# Patient Record
Sex: Female | Born: 1950
Health system: Southern US, Community
[De-identification: ages and names within clinical notes are randomized; demographics above are authoritative.]

## PROBLEM LIST (undated history)

## (undated) DIAGNOSIS — B019 Varicella without complication: Secondary | ICD-10-CM

## (undated) DIAGNOSIS — K219 Gastro-esophageal reflux disease without esophagitis: Secondary | ICD-10-CM

## (undated) DIAGNOSIS — E785 Hyperlipidemia, unspecified: Secondary | ICD-10-CM

## (undated) DIAGNOSIS — N390 Urinary tract infection, site not specified: Secondary | ICD-10-CM

## (undated) DIAGNOSIS — A630 Anogenital (venereal) warts: Secondary | ICD-10-CM

## (undated) HISTORY — PX: COLONOSCOPY WITH ESOPHAGOGASTRODUODENOSCOPY (EGD): SHX5779

## (undated) HISTORY — DX: Anogenital (venereal) warts: A63.0

## (undated) HISTORY — DX: Gastro-esophageal reflux disease without esophagitis: K21.9

## (undated) HISTORY — DX: Urinary tract infection, site not specified: N39.0

## (undated) HISTORY — DX: Varicella without complication: B01.9

---

## 2007-04-11 ENCOUNTER — Observation Stay (HOSPITAL_COMMUNITY): Admission: EM | Admit: 2007-04-11 | Discharge: 2007-04-12 | Payer: Self-pay | Admitting: Emergency Medicine

## 2007-07-04 ENCOUNTER — Ambulatory Visit: Payer: Self-pay | Admitting: Family Medicine

## 2007-07-04 DIAGNOSIS — E785 Hyperlipidemia, unspecified: Secondary | ICD-10-CM

## 2007-07-04 DIAGNOSIS — K219 Gastro-esophageal reflux disease without esophagitis: Secondary | ICD-10-CM | POA: Insufficient documentation

## 2007-07-04 DIAGNOSIS — A630 Anogenital (venereal) warts: Secondary | ICD-10-CM

## 2007-07-04 DIAGNOSIS — O99810 Abnormal glucose complicating pregnancy: Secondary | ICD-10-CM | POA: Insufficient documentation

## 2007-07-13 ENCOUNTER — Ambulatory Visit: Payer: Self-pay | Admitting: Family Medicine

## 2007-07-13 ENCOUNTER — Encounter: Payer: Self-pay | Admitting: Family Medicine

## 2007-07-13 ENCOUNTER — Other Ambulatory Visit: Admission: RE | Admit: 2007-07-13 | Discharge: 2007-07-13 | Payer: Self-pay | Admitting: Family Medicine

## 2007-07-14 LAB — CONVERTED CEMR LAB: Pap Smear: NORMAL

## 2007-07-20 ENCOUNTER — Encounter (INDEPENDENT_AMBULATORY_CARE_PROVIDER_SITE_OTHER): Payer: Self-pay | Admitting: *Deleted

## 2007-08-04 ENCOUNTER — Ambulatory Visit: Payer: Self-pay | Admitting: Family Medicine

## 2007-08-09 ENCOUNTER — Encounter (INDEPENDENT_AMBULATORY_CARE_PROVIDER_SITE_OTHER): Payer: Self-pay | Admitting: *Deleted

## 2007-10-04 ENCOUNTER — Ambulatory Visit: Payer: Self-pay | Admitting: Family Medicine

## 2007-10-11 LAB — CONVERTED CEMR LAB
ALT: 20 units/L (ref 0–35)
Albumin: 4 g/dL (ref 3.5–5.2)
Alkaline Phosphatase: 69 units/L (ref 39–117)
BUN: 11 mg/dL (ref 6–23)
CO2: 31 meq/L (ref 19–32)
Chloride: 105 meq/L (ref 96–112)
Cholesterol: 226 mg/dL (ref 0–200)
GFR calc non Af Amer: 79 mL/min
Glucose, Bld: 105 mg/dL — ABNORMAL HIGH (ref 70–99)
Sodium: 141 meq/L (ref 135–145)
Total CHOL/HDL Ratio: 6.3
VLDL: 44 mg/dL — ABNORMAL HIGH (ref 0–40)

## 2008-11-13 ENCOUNTER — Ambulatory Visit: Payer: Self-pay | Admitting: Family Medicine

## 2008-11-13 ENCOUNTER — Encounter (INDEPENDENT_AMBULATORY_CARE_PROVIDER_SITE_OTHER): Payer: Self-pay | Admitting: Internal Medicine

## 2008-11-13 DIAGNOSIS — R079 Chest pain, unspecified: Secondary | ICD-10-CM | POA: Insufficient documentation

## 2008-11-13 DIAGNOSIS — R209 Unspecified disturbances of skin sensation: Secondary | ICD-10-CM

## 2008-12-18 ENCOUNTER — Ambulatory Visit: Payer: Self-pay | Admitting: Family Medicine

## 2010-10-28 NOTE — H&P (Signed)
Kayla Ritter, Kayla Ritter                 ACCOUNT NO.:  [id number]   MEDICAL RECORD NO.:  [id number]          PATIENT TYPE:  EMS   LOCATION:  ED                           FACILITY[facility]  [facility]   PHYSICIAN:  Herbie Saxon, MDDATE OF BIRTH:  [birth year]   DATE OF ADMISSION:  04/11/2007  DATE OF DISCHARGE:                              HISTORY & PHYSICAL   PRIMARY CARE PHYSICIAN:  Unassigned.   POWER OF ATTORNEY:  Health care power of attorney is her husband,  Kayla Ritter, phone [phone], 11:53.   CODE STATUS:  She is a DNR.   PRESENTING COMPLAINT:  Chest pain 3 days' duration.   HISTORY OF PRESENTING COMPLAINT:  This is a 60 year old Caucasian lady  who was quite well until 3 days ago when she started noticing substernal  chest pressure, 8-9/10 in severity, radiating to the left arm with  occasional tingling in the left fingers, shortness of breath on moderate  exertion and intermittent palpitation.  No dizziness, no diaphoresis, no  syncopal episodes, no body swelling.  She denies any cough and history  of chest trauma.  There is no wheezing, no hemoptysis, no fever or joint  pain.  She has had frequent episodes of indigestion and heartburn.  She  takes Zantac for reflux disease.  She denies any abdominal pain,  diarrhea, constipation or change in bowel habits. No symptoms referable  to genitourinary, musculoskeletal, psychological systems.  The patient  recently lost a sister who died of a heart attack at age 31.  She is  anxious.   PAST MEDICAL HISTORY:  Gastroesophageal reflux disease.   PAST SURGICAL HISTORY:  Nil of note.   FAMILY HISTORY:  Sister with coronary artery disease.   SOCIAL HISTORY:  She is married, has 4 children, no tobacco, alcohol or  drug abuse.  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   MEDICATIONS:  Zantac as needed.   ALLERGIES:  CODEINE.   PHYSICAL EXAMINATION:  VITAL SIGNS:  The temperature is 98, pulse is 70,  respiratory rate 16,  blood pressure 161/84.  HEENT:  Pupils equal, round and reactive to light and accommodation.  Head is normocephalic, atraumatic.  Oropharynx and pharynx are clear.  NECK:  Neck is supple, there is negative jugulovenous distention or  thyromegaly.  No submandibular glandular lymph nodes.  Mucous membranes  are moist.  HEART:  Sounds 1 and 2 regular, no murmurs.  CHEST:  Clinically clear, no rales or rhonchi.  ABDOMEN:  Soft, nontender, no organomegaly.  Bowel sounds normoactive.  __________ reflexes are intact.  NEURO:  She is alert and oriented x 3, power is 5 in all limbs.  Peripheral pulses present. There is no pedal edema. Cranial nerves 2-12  intact.  Sensory system normal.  Gait is normal.   LABORATORY DATA:  Available lab showed WBC 7, hematocrit 43, platelet  count 330.  Troponin 0.05, CK-MB less than 1.  Other labs are pending.   ASSESSMENT:  Atypical chest pain.  Rule out acute coronary syndrome, new  onset hypertension, anxiety disorder.  Rule out musculoskeletal cause.  The patient admitted to observation telemetry, put on nitro paste,  oxygen as needed, aspirin, morphine intravenous as needed.  Circular  cardiac enzymes and EKG every 8 hours x 3.  Nothing by mouth from  midnight for possible stress echo in the morning.  I will obtain a 2D  echocardiogram.  Will check D-dimer, thyroid function test, lipids,  coagulation parameters, calcium, magnesium, phosphate and complete  metabolic panel. Check hemoccult.   MEDICATIONS:  She is to be on:  1. Lovenox 40 mg subcu daily.  2. Phenergan 12.5 mg IV every 8 hours as needed.  3. Protonix 1 mg IV daily.  4. Atrovent every 6 hours as needed.  5. O2 2 liters nasal canula as needed.  6. Metoprolol 25 mg daily.  7. Cardizem 10 mg IV every 6 hours as needed if heart rate is greater      than 109 or blood pressure greater than  159/109.  8. Xanax 0.5 mg b.i.d..   Nothing by mouth from midnight for possible stress echo in the  morning.  If cardiac enzymes turn positive still consider cardiology evaluation in  the morning on review of labs and clinical condition.      Herbie Saxon, MD  Electronically Signed     MIO/MEDQ  D:  04/11/2007  T:  04/12/2007  Job:  [id number]

## 2010-10-28 NOTE — Discharge Summary (Signed)
NAMESYMONE, CORNMAN                 ACCOUNT NO.:  [id number]   MEDICAL RECORD NO.:  [id number]          PATIENT TYPE:  OBV   LOCATION:  1415                         FACILITY:  [facility]   PHYSICIAN:  Lucita Ferrara, MD         DATE OF BIRTH:  Sep 07, 1950   DATE OF ADMISSION:  04/11/2007  DATE OF DISCHARGE:  04/12/2007                               DISCHARGE SUMMARY   ADMITTING DATE:  04/11/2007   DISCHARGE DATE:  04/12/2007   ADMITTING DIAGNOSIS:  Chest pain, shortness of breath.   DISCHARGE DIAGNOSES:  1. Atypical chest pain.  2. Gastroesophageal reflux disease.  3. Hyperlipidemia.   HOSPITAL COURSE:  Patient is a 60 year old female, who presented to  [facility] with chest pain, located in the anterior  substernal area, 9/10 in severity, radiating to the left shoulder.  She  had no diaphoresis, no dizziness, no syncope, no cough, no history of  trauma, no wheezing, no hemoptysis.  Otherwise, review of systems was  negative.  She did have some gastroesophageal reflux disease, heartburn  after eating.  Patient was very, very concerned when she initially  presented, because she just recently lost a sister from a heart attack.   PAST SURGICAL HISTORY:  None.   PAST MEDICAL HISTORY:  GERD, not very well-controlled.  She has never  had an endoscopy.   FAMILY HISTORY:  As above, sister with coronary artery disease.   HOSPITAL CONSULTATIONS:  None.   PROCEDURES:  Patient had EKG, 2D echocardiogram- Results; Overall Left  Ventricular Systolic function was normal. Left Ventricular EF was  estimated, range being 55-65%. There was no evidence of LV regional wall  motion abnormalitie. LV diastolic function parametes are normal.  The  EKG was normal sinus rhythm, no STT-wave changes.  Cardiac enzymes times  three negative.  Chest x-ray:  No cardiomegaly and no infiltrates, no  other acute findings.   Other result that was found was patient has high LDL.   DISCHARGE PLAN:   Patient is to be discharged home on Lipitor 20 mg  daily, continue aspirin 81 mg daily and also Nexium 40 mg p.o. daily for  control of her gastroesophageal reflux disease.  Chest pain is very  atypical.  If patient recurrently gets chest pain, will need outpatient  stress test.   CONDITION ON DISCHARGE:  Patient is able to ambulate without chest pain.  Chest pain is much better.  The patient is hemodynamically stable and  her chest pain has completely resolved at this point.      Lucita Ferrara, MD  Electronically Signed     RR/MEDQ  D:  04/12/2007  T:  04/13/2007  Job:  [id number]

## 2011-03-25 LAB — URINALYSIS, ROUTINE W REFLEX MICROSCOPIC
Glucose, UA: NEGATIVE
Nitrite: NEGATIVE
pH: 7

## 2011-03-25 LAB — COMPREHENSIVE METABOLIC PANEL
ALT: 25
Albumin: 4
BUN: 9
CO2: 28
Calcium: 9.1
Chloride: 100
Creatinine, Ser: 0.73
GFR calc non Af Amer: >60
Glucose, Bld: 103 — ABNORMAL HIGH
Total Bilirubin: 0.9
Total Protein: 7

## 2011-03-25 LAB — CBC
HCT: 43.6
Hemoglobin: 15.4 — ABNORMAL HIGH
Platelets: 330

## 2011-03-25 LAB — BASIC METABOLIC PANEL
BUN: 10
Chloride: 103
GFR calc Af Amer: >60
Glucose, Bld: 105 — ABNORMAL HIGH

## 2011-03-25 LAB — LIPID PANEL
LDL Cholesterol: 148 — ABNORMAL HIGH
Total CHOL/HDL Ratio: 7.5
VLDL: 46 — ABNORMAL HIGH

## 2011-03-25 LAB — DIFFERENTIAL
Basophils Relative: 0
Eosinophils Absolute: 0.1
Lymphocytes Relative: 30
Monocytes Relative: 6
Neutro Abs: 4.3
Neutrophils Relative %: 62

## 2011-03-25 LAB — CARDIAC PANEL(CRET KIN+CKTOT+MB+TROPI)
CK, MB: 0.9
Total CK: 53
Total CK: 55
Troponin I: 0.01
Troponin I: 0.02

## 2011-03-25 LAB — POCT CARDIAC MARKERS
CKMB, poc: <1 — ABNORMAL LOW
CKMB, poc: <1 — ABNORMAL LOW
CKMB, poc: <1 — ABNORMAL LOW
Myoglobin, poc: 59
Myoglobin, poc: 65.1
Operator id: 4295
Operator id: 4661
Troponin i, poc: <0.05

## 2011-03-25 LAB — PROTIME-INR: INR: 1

## 2011-03-25 LAB — APTT: aPTT: 24

## 2018-01-12 ENCOUNTER — Encounter (INDEPENDENT_AMBULATORY_CARE_PROVIDER_SITE_OTHER): Payer: Self-pay

## 2018-01-12 ENCOUNTER — Encounter: Payer: Self-pay | Admitting: Primary Care

## 2018-01-12 ENCOUNTER — Ambulatory Visit (INDEPENDENT_AMBULATORY_CARE_PROVIDER_SITE_OTHER): Payer: PPO | Admitting: Primary Care

## 2018-01-12 DIAGNOSIS — A630 Anogenital (venereal) warts: Secondary | ICD-10-CM

## 2018-01-12 DIAGNOSIS — K219 Gastro-esophageal reflux disease without esophagitis: Secondary | ICD-10-CM | POA: Diagnosis not present

## 2018-01-12 DIAGNOSIS — M8580 Other specified disorders of bone density and structure, unspecified site: Secondary | ICD-10-CM | POA: Diagnosis not present

## 2018-01-12 NOTE — Assessment & Plan Note (Signed)
Doing well on omeprazole 20 mg, continue same. 

## 2018-01-12 NOTE — Assessment & Plan Note (Signed)
Per bone density scan last year, compliant to alendronate 70 mg weekly and calcium and vitamin D.

## 2018-01-12 NOTE — Progress Notes (Signed)
Subjective:    Patient ID: Kayla Ritter, female    DOB: 1950-08-07, 67 y.o.   MRN: [id number]  HPI  Kayla Ritter is a 67 year old female who presents today to establish care and discuss the problems mentioned below. Will obtain old records. Her last physical was over one year.   1) GERD: Currently managed on omeprazole 20 mg daily with improvement in symptoms. She will experience esophageal reflux without medication. Once tried to wean up and ended up with gastritis.   2) Genital Warts: Diagnosed years ago, no changes.   3) Osteopenia: Completed one year ago, compliant to alendronate 70 mg once weekly. She's been taking for one year. She is also compliant to calcium and vitamin D.   Review of Systems  Respiratory: Negative for shortness of breath.   Cardiovascular: Negative for chest pain.  Gastrointestinal:       GERD  Genitourinary:       Genital wart  Neurological: Negative for dizziness and headaches.       Past Medical History:  Diagnosis Date  . Chickenpox   . Genital warts   . GERD (gastroesophageal reflux disease)   . Urinary tract infection      Social History   Socioeconomic History  . Marital status: Married    Spouse name: Not on file  . Number of children: Not on file  . Years of education: Not on file  . Highest education level: Not on file  Occupational History  . Not on file  Social Needs  . Financial resource strain: Not on file  . Food insecurity:    Worry: Not on file    Inability: Not on file  . Transportation needs:    Medical: Not on file    Non-medical: Not on file  Tobacco Use  . Smoking status: Former Research scientist (life sciences)  . Smokeless tobacco: Never Used  Substance and Sexual Activity  . Alcohol use: Yes  . Drug use: Not on file  . Sexual activity: Not on file  Lifestyle  . Physical activity:    Days per week: Not on file    Minutes per session: Not on file  . Stress: Not on file  Relationships  . Social connections:    Talks on phone: Not on  file    Gets together: Not on file    Attends religious service: Not on file    Active member of club or organization: Not on file    Attends meetings of clubs or organizations: Not on file    Relationship status: Not on file  . Intimate partner violence:    Fear of current or ex partner: Not on file    Emotionally abused: Not on file    Physically abused: Not on file    Forced sexual activity: Not on file  Other Topics Concern  . Not on file  Social History Narrative   Married.   4 children, 5 grandchildren.    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    Enjoys reading, crossword puzzles, spending time with grandchildren.     History reviewed. No pertinent surgical history.  Family History  Problem Relation Age of Onset  . Arthritis Mother   . Diabetes Mother   . Hearing loss Mother   . Hypertension Mother   . Kidney cancer Mother   . Hearing loss Father   . Asthma Brother     Allergies  Allergen Reactions  . Codeine  REACTION: N \\T \ V    Current Outpatient Medications on File Prior to Visit  Medication Sig Dispense Refill  . CALCIUM PO Take 1,000 mg by mouth daily.    . Cholecalciferol (VITAMIN D PO) Take 1,000 Units by mouth daily.    . Cyanocobalamin (B-12) 1000 MCG SUBL Place under the tongue.    Marland Kitchen omeprazole (PRILOSEC OTC) 20 MG tablet Take 20 mg by mouth daily.    . vitamin E 400 UNIT capsule Take 400 Units by mouth daily.    Marland Kitchen alendronate (FOSAMAX) 70 MG tablet Take 70 mg by mouth once a week.  3   No current facility-administered medications on file prior to visit.     BP 118/76   Pulse 71   Temp 98.2 F (36.8 C) (Oral)   Ht 4\' 11"  (1.499 m)   Wt 132 lb 8 oz (60.1 kg)   SpO2 98%   BMI 26.76 kg/m    Objective:   Physical Exam  Constitutional: She appears well-nourished.  Neck: Neck supple.  Cardiovascular: Normal rate and regular rhythm.  Respiratory: Effort normal and breath sounds normal.  Skin: Skin is warm and  dry.  Psychiatric: She has a normal mood and affect.           Assessment & Plan:

## 2018-01-12 NOTE — Assessment & Plan Note (Signed)
No changes. No new warts.

## 2018-01-12 NOTE — Patient Instructions (Signed)
Please schedule a physical with me and a Medicare Wellness Visit with our nurse anytime at your convenience.   Continue alendronate 70 mg weekly for bone density. Please notify your pharmacy when needing a refill.   It was a pleasure to meet you today! Please don't hesitate to call or message me with any questions. Welcome to [facility]!

## 2018-03-14 ENCOUNTER — Other Ambulatory Visit: Payer: Self-pay

## 2018-03-14 ENCOUNTER — Ambulatory Visit (INDEPENDENT_AMBULATORY_CARE_PROVIDER_SITE_OTHER): Payer: PPO

## 2018-03-14 VITALS — BP 108/72 | HR 62 | Temp 97.8°F | Ht 58.5 in | Wt 131.5 lb

## 2018-03-14 DIAGNOSIS — M810 Age-related osteoporosis without current pathological fracture: Secondary | ICD-10-CM

## 2018-03-14 DIAGNOSIS — Z1159 Encounter for screening for other viral diseases: Secondary | ICD-10-CM | POA: Diagnosis not present

## 2018-03-14 DIAGNOSIS — E559 Vitamin D deficiency, unspecified: Secondary | ICD-10-CM | POA: Diagnosis not present

## 2018-03-14 DIAGNOSIS — Z0000 Encounter for general adult medical examination without abnormal findings: Secondary | ICD-10-CM

## 2018-03-14 DIAGNOSIS — E782 Mixed hyperlipidemia: Secondary | ICD-10-CM | POA: Diagnosis not present

## 2018-03-14 LAB — CBC WITH DIFFERENTIAL/PLATELET
BASOS PCT: 1 % (ref 0.0–3.0)
Basophils Absolute: 0.1 10*3/uL (ref 0.0–0.1)
EOS PCT: 4.6 % (ref 0.0–5.0)
Eosinophils Absolute: 0.2 10*3/uL (ref 0.0–0.7)
HEMATOCRIT: 38.6 % (ref 36.0–46.0)
HEMOGLOBIN: 13 g/dL (ref 12.0–15.0)
LYMPHS PCT: 32.9 % (ref 12.0–46.0)
Lymphs Abs: 1.7 10*3/uL (ref 0.7–4.0)
MCHC: 33.7 g/dL (ref 30.0–36.0)
MCV: 85.9 fl (ref 78.0–100.0)
MONO ABS: 0.5 10*3/uL (ref 0.1–1.0)
Monocytes Relative: 8.7 % (ref 3.0–12.0)
Neutro Abs: 2.8 10*3/uL (ref 1.4–7.7)
Neutrophils Relative %: 52.8 % (ref 43.0–77.0)
Platelets: 266 10*3/uL (ref 150.0–400.0)
RBC: 4.49 Mil/uL (ref 3.87–5.11)
RDW: 14.4 % (ref 11.5–15.5)
WBC: 5.3 10*3/uL (ref 4.0–10.5)

## 2018-03-14 LAB — COMPREHENSIVE METABOLIC PANEL
ALBUMIN: 4.1 g/dL (ref 3.5–5.2)
ALK PHOS: 43 U/L (ref 39–117)
ALT: 12 U/L (ref 0–35)
AST: 14 U/L (ref 0–37)
BUN: 10 mg/dL (ref 6–23)
CALCIUM: 9.5 mg/dL (ref 8.4–10.5)
CO2: 31 mEq/L (ref 19–32)
CREATININE: 0.82 mg/dL (ref 0.40–1.20)
Chloride: 103 mEq/L (ref 96–112)
GFR: 73.84 mL/min (ref >60.00)
Glucose, Bld: 111 mg/dL — ABNORMAL HIGH (ref 70–99)
POTASSIUM: 4.2 meq/L (ref 3.5–5.1)
SODIUM: 140 meq/L (ref 135–145)
TOTAL PROTEIN: 7.2 g/dL (ref 6.0–8.3)
Total Bilirubin: 0.6 mg/dL (ref 0.2–1.2)

## 2018-03-14 LAB — LIPID PANEL
CHOLESTEROL: 239 mg/dL — AB (ref 0–200)
HDL: 47.2 mg/dL (ref >39.00)
NonHDL: 191.61
TRIGLYCERIDES: 210 mg/dL — AB (ref 0.0–149.0)
Total CHOL/HDL Ratio: 5
VLDL: 42 mg/dL — ABNORMAL HIGH (ref 0.0–40.0)

## 2018-03-14 LAB — LDL CHOLESTEROL, DIRECT: LDL DIRECT: 162 mg/dL

## 2018-03-14 LAB — TSH: TSH: 1.88 u[IU]/mL (ref 0.35–4.50)

## 2018-03-14 LAB — VITAMIN D 25 HYDROXY (VIT D DEFICIENCY, FRACTURES): VITD: 33.94 ng/mL (ref 30.00–100.00)

## 2018-03-14 MED ORDER — ALENDRONATE SODIUM 70 MG PO TABS: ORAL_TABLET | ORAL | 3 refills | Status: DC

## 2018-03-14 NOTE — Progress Notes (Signed)
PCP notes:   Health maintenance:  Flu vaccine - postponed PCV13 - postponed Hep C screening - completed  Abnormal screenings:   None  Patient concerns:   None  Nurse concerns:  None  Next PCP appt:   03/16/18 @ 3254

## 2018-03-14 NOTE — Telephone Encounter (Signed)
Patient in office today for AWV. Requested refill for Fosamax. Please send to [facility] [address] [place], [place].

## 2018-03-14 NOTE — Telephone Encounter (Signed)
Noted, refill sent to pharmacy. 

## 2018-03-14 NOTE — Progress Notes (Signed)
Subjective:   Kayla Ritter is a 67 y.o. female who presents for an Initial Medicare Annual Wellness Visit.  Review of Systems    N/A  Cardiac Risk Factors include: advanced age (>13men, >70 women);dyslipidemia     Objective:    Today's Vitals   03/14/18 0809  BP: 108/72  Pulse: 62  Temp: 97.8 F (36.6 C)  TempSrc: Oral  SpO2: 98%  Weight: 131 lb 8 oz (59.6 kg)  Height: 4' 10.5" (1.486 m)  PainSc: 0-No pain   Body mass index is 27.02 kg/m.  Advanced Directives 03/14/2018  Does Patient Have a Medical Advance Directive? Yes  Type of Advance Directive Living will    Current Medications (verified) Outpatient Encounter Medications as of 03/14/2018  Medication Sig  . alendronate (FOSAMAX) 70 MG tablet Take 70 mg by mouth once a week.  Marland Kitchen CALCIUM PO Take 1,000 mg by mouth daily.  . Cholecalciferol (VITAMIN D PO) Take 1,000 Units by mouth daily.  . Cyanocobalamin (B-12) 1000 MCG SUBL Place under the tongue.  Marland Kitchen omeprazole (PRILOSEC OTC) 20 MG tablet Take 20 mg by mouth daily.  . vitamin E 400 UNIT capsule Take 400 Units by mouth daily.   No facility-administered encounter medications on file as of 03/14/2018.     Allergies (verified) Codeine   History: Past Medical History:  Diagnosis Date  . Chickenpox   . Genital warts   . GERD (gastroesophageal reflux disease)   . Urinary tract infection    History reviewed. No pertinent surgical history. Family History  Problem Relation Age of Onset  . Arthritis Mother   . Diabetes Mother   . Hearing loss Mother   . Hypertension Mother   . Kidney cancer Mother   . Hearing loss Father   . Asthma Brother    Social History   Socioeconomic History  . Marital status: Married    Spouse name: Not on file  . Number of children: Not on file  . Years of education: Not on file  . Highest education level: Not on file  Occupational History  . Not on file  Social Needs  . Financial resource strain: Not on file  . Food  insecurity:    Worry: Not on file    Inability: Not on file  . Transportation needs:    Medical: Not on file    Non-medical: Not on file  Tobacco Use  . Smoking status: Former Research scientist (life sciences)  . Smokeless tobacco: Never Used  Substance and Sexual Activity  . Alcohol use: Yes    Comment: 1 glass of wine per month  . Drug use: Not Currently  . Sexual activity: Not on file  Lifestyle  . Physical activity:    Days per week: Not on file    Minutes per session: Not on file  . Stress: Not on file  Relationships  . Social connections:    Talks on phone: Not on file    Gets together: Not on file    Attends religious service: Not on file    Active member of club or organization: Not on file    Attends meetings of clubs or organizations: Not on file    Relationship status: Not on file  Other Topics Concern  . Not on file  Social History Narrative   Married.   4 children, 5 grandchildren.    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    Enjoys reading, crossword puzzles, spending time with grandchildren.  Tobacco Counseling Counseling given: No   Clinical Intake:  Pre-visit preparation completed: Yes  Pain : No/denies pain Pain Score: 0-No pain     Nutritional Status: BMI of 19-24  Normal Nutritional Risks: None Diabetes: No  How often do you need to have someone help you when you read instructions, pamphlets, or other written materials from your doctor or pharmacy?: 1 - Never What is the last grade level you completed in school?: 12th grade + some college  Interpreter Needed?: No  Comments: pt lives with spouse Information entered by :: LPinson, LPN   Activities of Daily Living In your present state of health, do you have any difficulty performing the following activities: 03/14/2018  Hearing? N  Vision? N  Difficulty concentrating or making decisions? N  Walking or climbing stairs? N  Dressing or bathing? N  Doing errands, shopping? N    Preparing Food and eating ? N  Using the Toilet? N  In the past six months, have you accidently leaked urine? N  Do you have problems with loss of bowel control? N  Managing your Medications? N  Managing your Finances? N  Housekeeping or managing your Housekeeping? N  Some recent data might be hidden     Immunizations and Health Maintenance Immunization History  Administered Date(s) Administered  . Td 06/16/2011   There are no preventive care reminders to display for this patient.  Patient Care Team: Pleas Koch, NP as PCP - General (Internal Medicine)  Indicate any recent Medical Services you may have received from other than [facility] providers in the past year (date may be approximate).     Assessment:   This is a routine wellness examination for Kayla Ritter.  Hearing/Vision screen  Hearing Screening   125Hz  250Hz  500Hz  1000Hz  2000Hz  3000Hz  4000Hz  6000Hz  8000Hz   Right ear:   40 40 40  40    Left ear:   40 40 40  40    Vision Screening Comments: Vision exam in August 2019 @ [facility]   Dietary issues and exercise activities discussed: Current Exercise Habits: The patient does not participate in regular exercise at present, Exercise limited by: None identified  Goals    . DIET - INCREASE WATER INTAKE     Starting 03/14/2018, I will continue to drink at least 6-8 glasses of water daily.      Depression Screen PHQ 2/9 Scores 03/14/2018  PHQ - 2 Score 0  PHQ- 9 Score 0    Fall Risk Fall Risk  03/14/2018  Falls in the past year? No   Cognitive Function: MMSE - Mini Mental State Exam 03/14/2018  Orientation to time 5  Orientation to Place 5  Registration 3  Attention/ Calculation 0  Recall 3  Language- name 2 objects 0  Language- repeat 1  Language- follow 3 step command 3  Language- read & follow direction 0  Write a sentence 0  Copy design 0  Total score 20    Screening Tests Health Maintenance  Topic Date Due  . INFLUENZA VACCINE   03/21/2018 (Originally 01/13/2018)  . PNA vac Low Risk Adult (1 of 2 - PCV13) 03/21/2018 (Originally 11/22/2015)  . MAMMOGRAM  01/14/2019  . TETANUS/TDAP  06/15/2021  . COLONOSCOPY  08/13/2024  . DEXA SCAN  Completed  . Hepatitis C Screening  Completed     Plan:     I have personally reviewed, addressed, and noted the following in the patient's chart:  A. Medical and social history B. Use  of alcohol, tobacco or illicit drugs  C. Current medications and supplements D. Functional ability and status E.  Nutritional status F.  Physical activity G. Advance directives H. List of other physicians I.  Hospitalizations, surgeries, and ER visits in previous 12 months J.  [place] to include hearing, vision, cognitive, depression L. Referrals and appointments - none  In addition, I have reviewed and discussed with patient certain preventive protocols, quality metrics, and best practice recommendations. A written personalized care plan for preventive services as well as general preventive health recommendations were provided to patient.  See attached scanned questionnaire for additional information.   Signed,   Lindell Noe, MHA, BS, LPN Health Coach

## 2018-03-14 NOTE — Patient Instructions (Signed)
Kayla Ritter , Thank you for taking time to come for your Medicare Wellness Visit. I appreciate your ongoing commitment to your health goals. Please review the following plan we discussed and let me know if I can assist you in the future.   These are the goals we discussed: Goals    . DIET - INCREASE WATER INTAKE     Starting 03/14/2018, I will continue to drink at least 6-8 glasses of water daily.       This is a list of the screening recommended for you and due dates:  Health Maintenance  Topic Date Due  . Flu Shot  03/21/2018*  . Pneumonia vaccines (1 of 2 - PCV13) 03/21/2018*  . Mammogram  01/14/2019  . Tetanus Vaccine  06/15/2021  . Colon Cancer Screening  08/13/2024  . DEXA scan (bone density measurement)  Completed  .  Hepatitis C: One time screening is recommended by Center for Disease Control  (CDC) for  adults born from [birth year] through 1965.   Completed  *Topic was postponed. The date shown is not the original due date.   Preventive Care for Adults  A healthy lifestyle and preventive care can promote health and wellness. Preventive health guidelines for adults include the following key practices.  . A routine yearly physical is a good way to check with your health care provider about your health and preventive screening. It is a chance to share any concerns and updates on your health and to receive a thorough exam.  . Visit your dentist for a routine exam and preventive care every 6 months. Brush your teeth twice a day and floss once a day. Good oral hygiene prevents tooth decay and gum disease.  . The frequency of eye exams is based on your age, health, family medical history, use  of contact lenses, and other factors. Follow your health care provider's recommendations for frequency of eye exams.  . Eat a healthy diet. Foods like vegetables, fruits, whole grains, low-fat dairy products, and lean protein foods contain the nutrients you need without too many calories. Decrease  your intake of foods high in solid fats, added sugars, and salt. Eat the right amount of calories for you. Get information about a proper diet from your health care provider, if necessary.  . Regular physical exercise is one of the most important things you can do for your health. Most adults should get at least 150 minutes of moderate-intensity exercise (any activity that increases your heart rate and causes you to sweat) each week. In addition, most adults need muscle-strengthening exercises on 2 or more days a week.  Silver Sneakers may be a benefit available to you. To determine eligibility, you may visit the website: www.silversneakers.com or contact program at 939-776-7114 Mon-Fri between 8AM-8PM.   . Maintain a healthy weight. The body mass index (BMI) is a screening tool to identify possible weight problems. It provides an estimate of body fat based on height and weight. Your health care provider can find your BMI and can help you achieve or maintain a healthy weight.   For adults 20 years and older: ? A BMI below 18.5 is considered underweight. ? A BMI of 18.5 to 24.9 is normal. ? A BMI of 25 to 29.9 is considered overweight. ? A BMI of 30 and above is considered obese.   . Maintain normal blood lipids and cholesterol levels by exercising and minimizing your intake of saturated fat. Eat a balanced diet with plenty of fruit and  vegetables. Blood tests for lipids and cholesterol should begin at age 13 and be repeated every 5 years. If your lipid or cholesterol levels are high, you are over 50, or you are at high risk for heart disease, you may need your cholesterol levels checked more frequently. Ongoing high lipid and cholesterol levels should be treated with medicines if diet and exercise are not working.  . If you smoke, find out from your health care provider how to quit. If you do not use tobacco, please do not start.  . If you choose to drink alcohol, please do not consume more than  2 drinks per day. One drink is considered to be 12 ounces (355 mL) of beer, 5 ounces (148 mL) of wine, or 1.5 ounces (44 mL) of liquor.  . If you are 54-1 years old, ask your health care provider if you should take aspirin to prevent strokes.  . Use sunscreen. Apply sunscreen liberally and repeatedly throughout the day. You should seek shade when your shadow is shorter than you. Protect yourself by wearing long sleeves, pants, a wide-brimmed hat, and sunglasses year round, whenever you are outdoors.  . Once a month, do a whole body skin exam, using a mirror to look at the skin on your back. Tell your health care provider of new moles, moles that have irregular borders, moles that are larger than a pencil eraser, or moles that have changed in shape or color.

## 2018-03-15 LAB — HEPATITIS C ANTIBODY
Hepatitis C Ab: NONREACTIVE
SIGNAL TO CUT-OFF: 0.01 (ref <1.00)

## 2018-03-16 ENCOUNTER — Encounter: Payer: Self-pay | Admitting: Primary Care

## 2018-03-16 ENCOUNTER — Ambulatory Visit (INDEPENDENT_AMBULATORY_CARE_PROVIDER_SITE_OTHER): Payer: PPO | Admitting: Primary Care

## 2018-03-16 ENCOUNTER — Other Ambulatory Visit: Payer: Self-pay | Admitting: Primary Care

## 2018-03-16 VITALS — BP 110/70 | HR 73 | Temp 98.2°F | Ht 58.5 in | Wt 131.5 lb

## 2018-03-16 DIAGNOSIS — E785 Hyperlipidemia, unspecified: Secondary | ICD-10-CM | POA: Diagnosis not present

## 2018-03-16 DIAGNOSIS — Z23 Encounter for immunization: Secondary | ICD-10-CM | POA: Diagnosis not present

## 2018-03-16 DIAGNOSIS — Z0000 Encounter for general adult medical examination without abnormal findings: Secondary | ICD-10-CM | POA: Diagnosis not present

## 2018-03-16 DIAGNOSIS — M810 Age-related osteoporosis without current pathological fracture: Secondary | ICD-10-CM | POA: Diagnosis not present

## 2018-03-16 DIAGNOSIS — Z0001 Encounter for general adult medical examination with abnormal findings: Secondary | ICD-10-CM | POA: Insufficient documentation

## 2018-03-16 DIAGNOSIS — R739 Hyperglycemia, unspecified: Secondary | ICD-10-CM | POA: Diagnosis not present

## 2018-03-16 DIAGNOSIS — R7303 Prediabetes: Secondary | ICD-10-CM

## 2018-03-16 DIAGNOSIS — M8580 Other specified disorders of bone density and structure, unspecified site: Secondary | ICD-10-CM

## 2018-03-16 DIAGNOSIS — K219 Gastro-esophageal reflux disease without esophagitis: Secondary | ICD-10-CM | POA: Diagnosis not present

## 2018-03-16 DIAGNOSIS — E119 Type 2 diabetes mellitus without complications: Secondary | ICD-10-CM | POA: Insufficient documentation

## 2018-03-16 LAB — POCT GLYCOSYLATED HEMOGLOBIN (HGB A1C): HEMOGLOBIN A1C: 6.2 % — AB (ref 4.0–5.6)

## 2018-03-16 MED ORDER — ALENDRONATE SODIUM 70 MG PO TABS: ORAL_TABLET | ORAL | 3 refills | Status: DC

## 2018-03-16 MED ORDER — ZOSTER VAC RECOMB ADJUVANTED 50 MCG/0.5ML IM SUSR
0.5000 mL | Freq: Once | INTRAMUSCULAR | 1 refills | Status: AC
Start: 2018-03-16 — End: 2018-03-16

## 2018-03-16 NOTE — Patient Instructions (Signed)
Continue exercising. You should be getting 150 minutes of moderate intensity exercise weekly.  Continue to work on [employer].  Ensure you are consuming 64 ounces of water daily.  We will repeat your mammogram and bone density tests next year.  We will see you in 1 year for your annual exam or sooner if needed.  It was a pleasure to see you today!   Preventive Care 68 Years and Older, Female Preventive care refers to lifestyle choices and visits with your health care provider that can promote health and wellness. What does preventive care include?  A yearly physical exam. This is also called an annual well check.  Dental exams once or twice a year.  Routine eye exams. Ask your health care provider how often you should have your eyes checked.  Personal lifestyle choices, including: ? Daily care of your teeth and gums. ? Regular physical activity. ? Eating a healthy diet. ? Avoiding tobacco and drug use. ? Limiting alcohol use. ? Practicing safe sex. ? Taking low-dose aspirin every day. ? Taking vitamin and mineral supplements as recommended by your health care provider. What happens during an annual well check? The services and screenings done by your health care provider during your annual well check will depend on your age, overall health, lifestyle risk factors, and family history of disease. Counseling Your health care provider may ask you questions about your:  Alcohol use.  Tobacco use.  Drug use.  Emotional well-being.  Home and relationship well-being.  Sexual activity.  Eating habits.  History of falls.  Memory and ability to understand (cognition).  Work and work Statistician.  Reproductive health.  Screening You may have the following tests or measurements:  Height, weight, and BMI.  Blood pressure.  Lipid and cholesterol levels. These may be checked every 5 years, or more frequently if you are over 87 years old.  Skin check.  Lung cancer  screening. You may have this screening every year starting at age 25 if you have a 30-pack-year history of smoking and currently smoke or have quit within the past 15 years.  Fecal occult blood test (FOBT) of the stool. You may have this test every year starting at age 66.  Flexible sigmoidoscopy or colonoscopy. You may have a sigmoidoscopy every 5 years or a colonoscopy every 10 years starting at age 3.  Hepatitis C blood test.  Hepatitis B blood test.  Sexually transmitted disease (STD) testing.  Diabetes screening. This is done by checking your blood sugar (glucose) after you have not eaten for a while (fasting). You may have this done every 1-3 years.  Bone density scan. This is done to screen for osteoporosis. You may have this done starting at age 56.  Mammogram. This may be done every 1-2 years. Talk to your health care provider about how often you should have regular mammograms.  Talk with your health care provider about your test results, treatment options, and if necessary, the need for more tests. Vaccines Your health care provider may recommend certain vaccines, such as:  Influenza vaccine. This is recommended every year.  Tetanus, diphtheria, and acellular pertussis (Tdap, Td) vaccine. You may need a Td booster every 10 years.  Varicella vaccine. You may need this if you have not been vaccinated.  Zoster vaccine. You may need this after age 36.  Measles, mumps, and rubella (MMR) vaccine. You may need at least one dose of MMR if you were born in 1957 or later. You may also  need a second dose.  Pneumococcal 13-valent conjugate (PCV13) vaccine. One dose is recommended after age 20.  Pneumococcal polysaccharide (PPSV23) vaccine. One dose is recommended after age 10.  Meningococcal vaccine. You may need this if you have certain conditions.  Hepatitis A vaccine. You may need this if you have certain conditions or if you travel or work in places where you may be exposed  to hepatitis A.  Hepatitis B vaccine. You may need this if you have certain conditions or if you travel or work in places where you may be exposed to hepatitis B.  Haemophilus influenzae type b (Hib) vaccine. You may need this if you have certain conditions.  Talk to your health care provider about which screenings and vaccines you need and how often you need them. This information is not intended to replace advice given to you by your health care provider. Make sure you discuss any questions you have with your health care provider. Document Released: 06/28/2015 Document Revised: 02/19/2016 Document Reviewed: 04/02/2015 Elsevier Interactive Patient Education  [employer].

## 2018-03-16 NOTE — Progress Notes (Signed)
Subjective:    Patient ID: Kayla Ritter, female    DOB: [birth year], 66 y.o.   MRN: [id number]  HPI  Kayla Ritter is a 67 year old female who presents today for complete physical.  BP Readings from Last 3 Encounters:  03/16/18 110/70  03/14/18 108/72  01/12/18 118/76     Immunizations: -Tetanus: Completed in 2013 -Influenza: Due -Pneumonia: Due -Shingles: Due  Diet: She endorses a healthy diet Breakfast: Oatmeal Lunch: String cheese, meat, vegetable  Dinner: Some meat, vegetable, salad Snacks: Popcorn, cheese/nuts/dried fruit Desserts: Occasionally  Beverages: Coffee, water, occasional wine  Exercise: She is not exercising, some walking  Eye exam: Completed in 2019 Dental exam: Completes every 6 months Colonoscopy: Completed 3 years ago.  Dexa: Completed in 2018 Mammogram: Completed in 2018 Hep C Screen: Negative in 2019    Review of Systems  Constitutional: Negative for unexpected weight change.  HENT: Negative for rhinorrhea.   Respiratory: Negative for cough and shortness of breath.   Cardiovascular: Negative for chest pain.  Gastrointestinal: Negative for constipation and diarrhea.  Genitourinary: Negative for difficulty urinating.  Musculoskeletal: Negative for arthralgias and myalgias.  Skin: Negative for rash.  Allergic/Immunologic: Negative for environmental allergies.  Neurological: Negative for dizziness, numbness and headaches.       Past Medical History:  Diagnosis Date  . Chickenpox   . Genital warts   . GERD (gastroesophageal reflux disease)   . Urinary tract infection      Social History   Socioeconomic History  . Marital status: Married    Spouse name: Not on file  . Number of children: Not on file  . Years of education: Not on file  . Highest education level: Not on file  Occupational History  . Not on file  Social Needs  . Financial resource strain: Not on file  . Food insecurity:    Worry: Not on file    Inability: Not on file    . Transportation needs:    Medical: Not on file    Non-medical: Not on file  Tobacco Use  . Smoking status: Former Research scientist (life sciences)  . Smokeless tobacco: Never Used  Substance and Sexual Activity  . Alcohol use: Yes    Comment: 1 glass of wine per month  . Drug use: Not Currently  . Sexual activity: Not on file  Lifestyle  . Physical activity:    Days per week: Not on file    Minutes per session: Not on file  . Stress: Not on file  Relationships  . Social connections:    Talks on phone: Not on file    Gets together: Not on file    Attends religious service: Not on file    Active member of club or organization: Not on file    Attends meetings of clubs or organizations: Not on file    Relationship status: Not on file  . Intimate partner violence:    Fear of current or ex partner: Not on file    Emotionally abused: Not on file    Physically abused: Not on file    Forced sexual activity: Not on file  Other Topics Concern  . Not on file  Social History Narrative   Married.   4 children, 5 grandchildren.    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    Enjoys reading, crossword puzzles, spending time with grandchildren.     No past surgical history on file.  Family History  Problem Relation Age  of Onset  . Arthritis Mother   . Diabetes Mother   . Hearing loss Mother   . Hypertension Mother   . Kidney cancer Mother   . Hearing loss Father   . Asthma Brother     Allergies  Allergen Reactions  . Codeine     REACTION: N \\T \ V    Current Outpatient Medications on File Prior to Visit  Medication Sig Dispense Refill  . CALCIUM PO Take 1,000 mg by mouth daily.    . Cholecalciferol (VITAMIN D PO) Take 1,000 Units by mouth daily.    . Cyanocobalamin (B-12) 1000 MCG SUBL Place under the tongue.    Marland Kitchen omeprazole (PRILOSEC OTC) 20 MG tablet Take 20 mg by mouth daily.    . vitamin E 400 UNIT capsule Take 400 Units by mouth daily.     No current  facility-administered medications on file prior to visit.     BP 110/70   Pulse 73   Temp 98.2 F (36.8 C) (Oral)   Ht 4' 10.5" (1.486 m)   Wt 131 lb 8 oz (59.6 kg)   SpO2 97%   BMI 27.02 kg/m    Objective:   Physical Exam  Constitutional: She is oriented to person, place, and time. She appears well-nourished.  HENT:  Mouth/Throat: No oropharyngeal exudate.  Eyes: Pupils are equal, round, and reactive to light. EOM are normal.  Neck: Neck supple. No thyromegaly present.  Cardiovascular: Normal rate and regular rhythm.  Respiratory: Effort normal and breath sounds normal.  GI: Soft. Bowel sounds are normal. There is no tenderness.  Musculoskeletal: Normal range of motion.  Neurological: She is alert and oriented to person, place, and time.  Skin: Skin is warm and dry.  Psychiatric: She has a normal mood and affect.           Assessment & Plan:

## 2018-03-16 NOTE — Assessment & Plan Note (Signed)
Td UTD, due for pneumovax and influenza vaccinations. Both provided today. Rx for Shingrix printed. Mammogram and bone density scans due in 2020. Colonoscopy UTD. Exam unremarkable. Labs reviewed. Follow up in 1 year for CPE.

## 2018-03-16 NOTE — Assessment & Plan Note (Signed)
Above goal, however, improved from last year.  Continue to monitor. Discussed to work on regular exercise.

## 2018-03-16 NOTE — Assessment & Plan Note (Signed)
Noted hyperglycemia on recent labs. A1C of 6.2. Discussed to work on diet and start exercising. Repeat in 6 months.

## 2018-03-16 NOTE — Assessment & Plan Note (Signed)
Repeat bone density scan due in 2020. Continue alendronate weekly. Discussed to continue calcium, increase vitamin D to 2000 units.

## 2018-03-16 NOTE — Assessment & Plan Note (Signed)
Doing well on current regimen, continue omeprazole 20 mg.

## 2018-03-16 NOTE — Addendum Note (Signed)
Addended by: Jacqualin Combes on: 03/16/2018 11:51 AM   Modules accepted: Orders

## 2018-03-17 NOTE — Progress Notes (Signed)
I reviewed health advisor's note, was available for consultation, and agree with documentation and plan.  

## 2018-03-21 ENCOUNTER — Ambulatory Visit: Payer: PPO

## 2018-07-27 DIAGNOSIS — D1801 Hemangioma of skin and subcutaneous tissue: Secondary | ICD-10-CM | POA: Diagnosis not present

## 2018-07-27 DIAGNOSIS — L821 Other seborrheic keratosis: Secondary | ICD-10-CM | POA: Diagnosis not present

## 2018-07-27 DIAGNOSIS — L814 Other melanin hyperpigmentation: Secondary | ICD-10-CM | POA: Diagnosis not present

## 2018-07-27 DIAGNOSIS — L570 Actinic keratosis: Secondary | ICD-10-CM | POA: Diagnosis not present

## 2018-07-27 DIAGNOSIS — D225 Melanocytic nevi of trunk: Secondary | ICD-10-CM | POA: Diagnosis not present

## 2019-01-03 ENCOUNTER — Other Ambulatory Visit: Payer: Self-pay

## 2019-01-03 ENCOUNTER — Telehealth: Payer: Self-pay | Admitting: Family Medicine

## 2019-01-03 DIAGNOSIS — Z20828 Contact with and (suspected) exposure to other viral communicable diseases: Secondary | ICD-10-CM | POA: Diagnosis not present

## 2019-01-03 DIAGNOSIS — Z20822 Contact with and (suspected) exposure to covid-19: Secondary | ICD-10-CM

## 2019-01-03 NOTE — Telephone Encounter (Signed)
Patient advised where to go for testing site.

## 2019-01-03 NOTE — Telephone Encounter (Signed)
Patient with covid exposure and needs testing.  Please call about setting up testing at [phone]. I put in the order.   Needs to be tested along with family member- see separate phone note.   Thanks.

## 2019-01-03 NOTE — Progress Notes (Signed)
co

## 2019-01-05 LAB — NOVEL CORONAVIRUS, NAA: SARS-CoV-2, NAA: DETECTED — AB

## 2019-01-06 ENCOUNTER — Telehealth: Payer: Self-pay

## 2019-01-06 NOTE — Telephone Encounter (Addendum)
Noted and appreciate the follow up. 

## 2019-01-06 NOTE — Telephone Encounter (Signed)
Called patient to give COVID results, discuss symptoms and review quarantining  procedure.   Patient is currently asymptomatic and doing well.  She also states that her husband is still in the hospital at [facility] and will be there until Monday at least. Father, who lives with them, is also positive but doing well.    She has no questions for me at this point.  I will continue to make calls to her every few days to follow up on her and her family through this illness.

## 2019-01-09 ENCOUNTER — Telehealth: Payer: Self-pay

## 2019-01-09 NOTE — Telephone Encounter (Signed)
Noted and appreciate the follow up. 

## 2019-01-09 NOTE — Telephone Encounter (Signed)
Spoke with patient.  She is doing well, asymptomatic still.  Husband is still in [facility] but his condition is improving and they are hopeful for a discharge before to long.   No questions or concerns at this point.

## 2019-01-12 ENCOUNTER — Telehealth: Payer: Self-pay

## 2019-01-12 NOTE — Telephone Encounter (Signed)
Spoke with patient.  She is doing well still asymptomatic.   Husband is home from hospital and is recuperating.  Will continue biweekly covid calls to follow up on status of family as needed.   Patient will be 2 weeks out next week, if still asymptomatic will stop monitoring calls for her and focus on husband and father as needed.

## 2019-01-12 NOTE — Telephone Encounter (Signed)
Noted, appreciate the follow-up

## 2019-01-20 ENCOUNTER — Telehealth: Payer: Self-pay

## 2019-01-20 NOTE — Telephone Encounter (Signed)
Patient is doing well and does not have any COVID symptoms.  She is now out of the window for monitoring and will stop home calls at this time.  Patient aware if any symptoms or concerns develop to please call our office.   She thanks [place] for the monitoring phone calls.

## 2019-01-23 NOTE — Telephone Encounter (Signed)
noted 

## 2019-01-24 NOTE — Telephone Encounter (Signed)
Noted and agree. 

## 2019-03-08 ENCOUNTER — Other Ambulatory Visit: Payer: Self-pay | Admitting: Primary Care

## 2019-03-08 DIAGNOSIS — R7303 Prediabetes: Secondary | ICD-10-CM

## 2019-03-08 DIAGNOSIS — E785 Hyperlipidemia, unspecified: Secondary | ICD-10-CM

## 2019-03-17 ENCOUNTER — Other Ambulatory Visit (INDEPENDENT_AMBULATORY_CARE_PROVIDER_SITE_OTHER): Payer: PPO

## 2019-03-17 ENCOUNTER — Other Ambulatory Visit: Payer: Self-pay

## 2019-03-17 ENCOUNTER — Ambulatory Visit (INDEPENDENT_AMBULATORY_CARE_PROVIDER_SITE_OTHER): Payer: PPO

## 2019-03-17 DIAGNOSIS — E785 Hyperlipidemia, unspecified: Secondary | ICD-10-CM

## 2019-03-17 DIAGNOSIS — R7303 Prediabetes: Secondary | ICD-10-CM

## 2019-03-17 DIAGNOSIS — Z0000 Encounter for general adult medical examination without abnormal findings: Secondary | ICD-10-CM

## 2019-03-17 LAB — COMPREHENSIVE METABOLIC PANEL
ALT: 14 U/L (ref 0–35)
AST: 16 U/L (ref 0–37)
Albumin: 4.1 g/dL (ref 3.5–5.2)
Alkaline Phosphatase: 45 U/L (ref 39–117)
BUN: 16 mg/dL (ref 6–23)
CO2: 32 mEq/L (ref 19–32)
Calcium: 9.8 mg/dL (ref 8.4–10.5)
Chloride: 103 mEq/L (ref 96–112)
Creatinine, Ser: 0.82 mg/dL (ref 0.40–1.20)
GFR: 69.26 mL/min (ref >60.00)
Glucose, Bld: 107 mg/dL — ABNORMAL HIGH (ref 70–99)
Potassium: 4.2 mEq/L (ref 3.5–5.1)
Sodium: 142 mEq/L (ref 135–145)
Total Bilirubin: 0.5 mg/dL (ref 0.2–1.2)
Total Protein: 6.6 g/dL (ref 6.0–8.3)

## 2019-03-17 LAB — CBC
HCT: 40.4 % (ref 36.0–46.0)
Hemoglobin: 13.6 g/dL (ref 12.0–15.0)
MCHC: 33.8 g/dL (ref 30.0–36.0)
MCV: 89.8 fl (ref 78.0–100.0)
Platelets: 259 10*3/uL (ref 150.0–400.0)
RBC: 4.49 Mil/uL (ref 3.87–5.11)
RDW: 13.7 % (ref 11.5–15.5)
WBC: 5.1 10*3/uL (ref 4.0–10.5)

## 2019-03-17 LAB — LIPID PANEL
Cholesterol: 245 mg/dL — ABNORMAL HIGH (ref 0–200)
HDL: 45.8 mg/dL (ref >39.00)
LDL Cholesterol: 173 mg/dL — ABNORMAL HIGH (ref 0–99)
NonHDL: 198.97
Total CHOL/HDL Ratio: 5
Triglycerides: 131 mg/dL (ref 0.0–149.0)
VLDL: 26.2 mg/dL (ref 0.0–40.0)

## 2019-03-17 LAB — HEMOGLOBIN A1C: Hgb A1c MFr Bld: 6.5 % (ref 4.6–6.5)

## 2019-03-17 NOTE — Patient Instructions (Signed)
Kayla Ritter , Thank you for taking time to come for your Medicare Wellness Visit. I appreciate your ongoing commitment to your health goals. Please review the following plan we discussed and let me know if I can assist you in the future.   Screening recommendations/referrals: Colonoscopy: up to date, completed 08/14/2014 Mammogram: will have the office to setup appointment Bone Density: up to date, completed 08/25/2016 Recommended yearly ophthalmology/optometry visit for glaucoma screening and checkup Recommended yearly dental visit for hygiene and checkup  Vaccinations: Influenza vaccine: will get next month  Pneumococcal vaccine: will get at next week's office visit  Tdap vaccine: up to date, completed 06/16/2011 Shingles vaccine: #1 08/13/2018    Advanced directives: Advance directive discussed with you today. Even though you declined this today please call our office should you change your mind and we can give you the proper paperwork for you to fill out.  Conditions/risks identified: hyperlipidemia  Next appointment: 03/20/2019 @ 11 am   Preventive Care 65 Years and Older, Female Preventive care refers to lifestyle choices and visits with your health care provider that can promote health and wellness. What does preventive care include?  A yearly physical exam. This is also called an annual well check.  Dental exams once or twice a year.  Routine eye exams. Ask your health care provider how often you should have your eyes checked.  Personal lifestyle choices, including:  Daily care of your teeth and gums.  Regular physical activity.  Eating a healthy diet.  Avoiding tobacco and drug use.  Limiting alcohol use.  Practicing safe sex.  Taking low-dose aspirin every day.  Taking vitamin and mineral supplements as recommended by your health care provider. What happens during an annual well check? The services and screenings done by your health care provider during your annual  well check will depend on your age, overall health, lifestyle risk factors, and family history of disease. Counseling  Your health care provider may ask you questions about your:  Alcohol use.  Tobacco use.  Drug use.  Emotional well-being.  Home and relationship well-being.  Sexual activity.  Eating habits.  History of falls.  Memory and ability to understand (cognition).  Work and work Statistician.  Reproductive health. Screening  You may have the following tests or measurements:  Height, weight, and BMI.  Blood pressure.  Lipid and cholesterol levels. These may be checked every 5 years, or more frequently if you are over 41 years old.  Skin check.  Lung cancer screening. You may have this screening every year starting at age 68 if you have a 30-pack-year history of smoking and currently smoke or have quit within the past 15 years.  Fecal occult blood test (FOBT) of the stool. You may have this test every year starting at age 69.  Flexible sigmoidoscopy or colonoscopy. You may have a sigmoidoscopy every 5 years or a colonoscopy every 10 years starting at age 65.  Hepatitis C blood test.  Hepatitis B blood test.  Sexually transmitted disease (STD) testing.  Diabetes screening. This is done by checking your blood sugar (glucose) after you have not eaten for a while (fasting). You may have this done every 1-3 years.  Bone density scan. This is done to screen for osteoporosis. You may have this done starting at age 51.  Mammogram. This may be done every 1-2 years. Talk to your health care provider about how often you should have regular mammograms. Talk with your health care provider about your test  results, treatment options, and if necessary, the need for more tests. Vaccines  Your health care provider may recommend certain vaccines, such as:  Influenza vaccine. This is recommended every year.  Tetanus, diphtheria, and acellular pertussis (Tdap, Td) vaccine.  You may need a Td booster every 10 years.  Zoster vaccine. You may need this after age 57.  Pneumococcal 13-valent conjugate (PCV13) vaccine. One dose is recommended after age 37.  Pneumococcal polysaccharide (PPSV23) vaccine. One dose is recommended after age 30. Talk to your health care provider about which screenings and vaccines you need and how often you need them. This information is not intended to replace advice given to you by your health care provider. Make sure you discuss any questions you have with your health care provider. Document Released: 06/28/2015 Document Revised: 02/19/2016 Document Reviewed: 04/02/2015 Elsevier Interactive Patient Education  2017 [place] Prevention in the Home Falls can cause injuries. They can happen to people of all ages. There are many things you can do to make your home safe and to help prevent falls. What can I do on the outside of my home?  Regularly fix the edges of walkways and driveways and fix any cracks.  Remove anything that might make you trip as you walk through a door, such as a raised step or threshold.  Trim any bushes or trees on the path to your home.  Use bright outdoor lighting.  Clear any walking paths of anything that might make someone trip, such as rocks or tools.  Regularly check to see if handrails are loose or broken. Make sure that both sides of any steps have handrails.  Any raised decks and porches should have guardrails on the edges.  Have any leaves, snow, or ice cleared regularly.  Use sand or salt on walking paths during winter.  Clean up any spills in your garage right away. This includes oil or grease spills. What can I do in the bathroom?  Use night lights.  Install grab bars by the toilet and in the tub and shower. Do not use towel bars as grab bars.  Use non-skid mats or decals in the tub or shower.  If you need to sit down in the shower, use a plastic, non-slip stool.  Keep the  floor dry. Clean up any water that spills on the floor as soon as it happens.  Remove soap buildup in the tub or shower regularly.  Attach bath mats securely with double-sided non-slip rug tape.  Do not have throw rugs and other things on the floor that can make you trip. What can I do in the bedroom?  Use night lights.  Make sure that you have a light by your bed that is easy to reach.  Do not use any sheets or blankets that are too big for your bed. They should not hang down onto the floor.  Have a firm chair that has side arms. You can use this for support while you get dressed.  Do not have throw rugs and other things on the floor that can make you trip. What can I do in the kitchen?  Clean up any spills right away.  Avoid walking on wet floors.  Keep items that you use a lot in easy-to-reach places.  If you need to reach something above you, use a strong step stool that has a grab bar.  Keep electrical cords out of the way.  Do not use floor polish or wax that makes  floors slippery. If you must use wax, use non-skid floor wax.  Do not have throw rugs and other things on the floor that can make you trip. What can I do with my stairs?  Do not leave any items on the stairs.  Make sure that there are handrails on both sides of the stairs and use them. Fix handrails that are broken or loose. Make sure that handrails are as long as the stairways.  Check any carpeting to make sure that it is firmly attached to the stairs. Fix any carpet that is loose or worn.  Avoid having throw rugs at the top or bottom of the stairs. If you do have throw rugs, attach them to the floor with carpet tape.  Make sure that you have a light switch at the top of the stairs and the bottom of the stairs. If you do not have them, ask someone to add them for you. What else can I do to help prevent falls?  Wear shoes that:  Do not have high heels.  Have rubber bottoms.  Are comfortable and fit  you well.  Are closed at the toe. Do not wear sandals.  If you use a stepladder:  Make sure that it is fully opened. Do not climb a closed stepladder.  Make sure that both sides of the stepladder are locked into place.  Ask someone to hold it for you, if possible.  Clearly mark and make sure that you can see:  Any grab bars or handrails.  First and last steps.  Where the edge of each step is.  Use tools that help you move around (mobility aids) if they are needed. These include:  Canes.  Walkers.  Scooters.  Crutches.  Turn on the lights when you go into a dark area. Replace any light bulbs as soon as they burn out.  Set up your furniture so you have a clear path. Avoid moving your furniture around.  If any of your floors are uneven, fix them.  If there are any pets around you, be aware of where they are.  Review your medicines with your doctor. Some medicines can make you feel dizzy. This can increase your chance of falling. Ask your doctor what other things that you can do to help prevent falls. This information is not intended to replace advice given to you by your health care provider. Make sure you discuss any questions you have with your health care provider. Document Released: 03/28/2009 Document Revised: 11/07/2015 Document Reviewed: 07/06/2014 Elsevier Interactive Patient Education  2017 [employer].

## 2019-03-17 NOTE — Progress Notes (Signed)
PCP notes: none  Health Maintenance: Patient wants office to setup an appointment for her mammogram. Patient will get prevnar 13 at her physical. Patient will get flu vaccine next month.     Abnormal Screenings: none    Patient concerns: Patient has been taking omeprazole for acid reflux and it is no longer working.     Nurse concerns: none    Next PCP appt.: 03/20/2019 @ 11 am

## 2019-03-17 NOTE — Progress Notes (Signed)
Subjective:   Kayla Ritter is a 68 y.o. female who presents for Medicare Annual (Subsequent) preventive examination.  Review of Systems:    This visit is being conducted through telemedicine via telephone at the nurse health advisor's home address due to the COVID-19 pandemic. This patient has given me verbal consent via doximity to conduct this visit, patient states they are participating from their home address. Some vital signs may be absent or patient reported.    Patient identification: identified by name, DOB, and current address   Cardiac Risk Factors include: advanced age (>67men, >72 women);dyslipidemia     Objective:     Vitals: There were no vitals taken for this visit.  There is no height or weight on file to calculate BMI.  Advanced Directives 03/17/2019 03/14/2018  Does Patient Have a Medical Advance Directive? No Yes  Type of Advance Directive - Living will  Would patient like information on creating a medical advance directive? No - Patient declined -    Tobacco Social History   Tobacco Use  Smoking Status Former Smoker  Smokeless Tobacco Never Used     Counseling given: Not Answered   Clinical Intake:  Pre-visit preparation completed: Yes  Pain : No/denies pain     Nutritional Risks: None Diabetes: No  How often do you need to have someone help you when you read instructions, pamphlets, or other written materials from your doctor or pharmacy?: 1 - Never What is the last grade level you completed in school?: 12th  Interpreter Needed?: No  Information entered by :: CJohnson, LPN  Past Medical History:  Diagnosis Date  . Chickenpox   . Genital warts   . GERD (gastroesophageal reflux disease)   . Urinary tract infection    History reviewed. No pertinent surgical history. Family History  Problem Relation Age of Onset  . Arthritis Mother   . Diabetes Mother   . Hearing loss Mother   . Hypertension Mother   . Kidney cancer Mother   .  Hearing loss Father   . Asthma Brother    Social History   Socioeconomic History  . Marital status: Married    Spouse name: Not on file  . Number of children: Not on file  . Years of education: Not on file  . Highest education level: Not on file  Occupational History  . Not on file  Social Needs  . Financial resource strain: Not hard at all  . Food insecurity    Worry: Never true    Inability: Never true  . Transportation needs    Medical: No    Non-medical: No  Tobacco Use  . Smoking status: Former Research scientist (life sciences)  . Smokeless tobacco: Never Used  Substance and Sexual Activity  . Alcohol use: Yes    Comment: 1 glass of wine per month  . Drug use: Not Currently  . Sexual activity: Not on file  Lifestyle  . Physical activity    Days per week: 7 days    Minutes per session: 40 min  . Stress: Not at all  Relationships  . Social Herbalist on phone: Not on file    Gets together: Not on file    Attends religious service: Not on file    Active member of club or organization: Not on file    Attends meetings of clubs or organizations: Not on file    Relationship status: Not on file  Other Topics Concern  . Not on file  Social History Narrative   Married.   4 children, 5 grandchildren.    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    Enjoys reading, crossword puzzles, spending time with grandchildren.     Outpatient Encounter Medications as of 03/17/2019  Medication Sig  . alendronate (FOSAMAX) 70 MG tablet Take 1 tablet by mouth once weekly for osteoporosis. Do not lay down within 1 hour after taking.  Marland Kitchen CALCIUM PO Take 1,000 mg by mouth daily.  . Cholecalciferol (VITAMIN D PO) Take 1,000 Units by mouth daily.  . Cyanocobalamin (B-12) 1000 MCG SUBL Place under the tongue.  Marland Kitchen omeprazole (PRILOSEC OTC) 20 MG tablet Take 20 mg by mouth daily.  . vitamin E 400 UNIT capsule Take 400 Units by mouth daily.   No facility-administered encounter  medications on file as of 03/17/2019.     Activities of Daily Living In your present state of health, do you have any difficulty performing the following activities: 03/17/2019  Hearing? N  Vision? N  Difficulty concentrating or making decisions? N  Walking or climbing stairs? N  Dressing or bathing? N  Doing errands, shopping? N  Preparing Food and eating ? N  Using the Toilet? N  In the past six months, have you accidently leaked urine? N  Do you have problems with loss of bowel control? N  Managing your Medications? N  Managing your Finances? N  Housekeeping or managing your Housekeeping? N  Some recent data might be hidden    Patient Care Team: Pleas Koch, NP as PCP - General (Internal Medicine)    Assessment:   This is a routine wellness examination for Kayla Ritter.  Exercise Activities and Dietary recommendations Current Exercise Habits: Home exercise routine, Type of exercise: walking, Time (Minutes): 45, Frequency (Times/Week): 7, Weekly Exercise (Minutes/Week): 315, Intensity: Moderate, Exercise limited by: None identified  Goals    . DIET - INCREASE WATER INTAKE     Starting 03/14/2018, I will continue to drink at least 6-8 glasses of water daily.    . Patient Stated     03/17/2019, Patient wants to maintain and continue medications as prescribed.        Fall Risk Fall Risk  03/17/2019 03/14/2018  Falls in the past year? 0 No  Follow up Falls evaluation completed;Falls prevention discussed -   Is the patient's home free of loose throw rugs in walkways, pet beds, electrical cords, etc?   yes      Grab bars in the bathroom? yes      Handrails on the stairs?   yes      Adequate lighting?   yes  Timed Get Up and Go performed: n/a  Depression Screen PHQ 2/9 Scores 03/17/2019 03/14/2018  PHQ - 2 Score 0 0  PHQ- 9 Score 0 0     Cognitive Function MMSE - Mini Mental State Exam 03/17/2019 03/14/2018  Orientation to time 5 5  Orientation to Place 5 5   Registration 3 3  Attention/ Calculation 5 0  Recall 3 3  Language- name 2 objects - 0  Language- repeat 1 1  Language- follow 3 step command - 3  Language- read & follow direction - 0  Write a sentence - 0  Copy design - 0  Total score - 20  Mini Cog  Mini-Cog screen was completed. Maximum score is 22. A value of 0 denotes this part of the MMSE was not completed or the patient failed this part of  the Mini-Cog screening.       Immunization History  Administered Date(s) Administered  . Influenza,inj,Quad PF,6+ Mos 03/16/2018  . Pneumococcal Polysaccharide-23 03/16/2018  . Td 06/16/2011  . Zoster Recombinat (Shingrix) 08/13/2018    Qualifies for Shingles Vaccine? Completed 08/13/2018  Screening Tests Health Maintenance  Topic Date Due  . INFLUENZA VACCINE  01/14/2019  . MAMMOGRAM  01/14/2019  . PNA vac Low Risk Adult (2 of 2 - PCV13) 03/17/2019  . TETANUS/TDAP  06/15/2021  . COLONOSCOPY  08/13/2024  . DEXA SCAN  Completed  . Hepatitis C Screening  Completed    Cancer Screenings: Lung: Low Dose CT Chest recommended if Age 52-80 years, 30 pack-year currently smoking OR have quit w/in 15years. Patient does not qualify. Breast:  Up to date on Mammogram? No, will have office setup appointment   Up to date of Bone Density/Dexa? Yes, completed 08/25/2016 Colorectal: completed 08/14/2014  Additional Screenings:  Hepatitis C Screening: 03/14/2018     Plan:   Patient wants to maintain and continue medications as prescribed.   I have personally reviewed and noted the following in the patient's chart:   . Medical and social history . Use of alcohol, tobacco or illicit drugs  . Current medications and supplements . Functional ability and status . Nutritional status . Physical activity . Advanced directives . List of other physicians . Hospitalizations, surgeries, and ER visits in previous 12 months . Vitals . Screenings to include cognitive, depression, and falls .  Referrals and appointments  In addition, I have reviewed and discussed with patient certain preventive protocols, quality metrics, and best practice recommendations. A written personalized care plan for preventive services as well as general preventive health recommendations were provided to patient.     Andrez Grime, LPN  [phone]

## 2019-03-20 ENCOUNTER — Encounter: Payer: Self-pay | Admitting: Primary Care

## 2019-03-20 ENCOUNTER — Other Ambulatory Visit: Payer: Self-pay

## 2019-03-20 ENCOUNTER — Ambulatory Visit: Payer: PPO

## 2019-03-20 ENCOUNTER — Ambulatory Visit (INDEPENDENT_AMBULATORY_CARE_PROVIDER_SITE_OTHER): Payer: PPO | Admitting: Primary Care

## 2019-03-20 VITALS — BP 114/70 | HR 61 | Temp 97.7°F | Ht 58.5 in | Wt 124.5 lb

## 2019-03-20 DIAGNOSIS — M8580 Other specified disorders of bone density and structure, unspecified site: Secondary | ICD-10-CM

## 2019-03-20 DIAGNOSIS — K219 Gastro-esophageal reflux disease without esophagitis: Secondary | ICD-10-CM

## 2019-03-20 DIAGNOSIS — E119 Type 2 diabetes mellitus without complications: Secondary | ICD-10-CM | POA: Diagnosis not present

## 2019-03-20 DIAGNOSIS — Z23 Encounter for immunization: Secondary | ICD-10-CM

## 2019-03-20 DIAGNOSIS — E2839 Other primary ovarian failure: Secondary | ICD-10-CM | POA: Diagnosis not present

## 2019-03-20 DIAGNOSIS — Z0000 Encounter for general adult medical examination without abnormal findings: Secondary | ICD-10-CM

## 2019-03-20 DIAGNOSIS — Z1231 Encounter for screening mammogram for malignant neoplasm of breast: Secondary | ICD-10-CM

## 2019-03-20 DIAGNOSIS — M810 Age-related osteoporosis without current pathological fracture: Secondary | ICD-10-CM | POA: Diagnosis not present

## 2019-03-20 DIAGNOSIS — E785 Hyperlipidemia, unspecified: Secondary | ICD-10-CM

## 2019-03-20 LAB — MICROALBUMIN / CREATININE URINE RATIO
Creatinine,U: 34.6 mg/dL
Microalb Creat Ratio: 2 mg/g (ref 0.0–30.0)
Microalb, Ur: <0.7 mg/dL (ref 0.0–1.9)

## 2019-03-20 MED ORDER — ATORVASTATIN CALCIUM 40 MG PO TABS: 40.0000 mg | ORAL_TABLET | Freq: Every evening | ORAL | 3 refills | Status: DC

## 2019-03-20 MED ORDER — ALENDRONATE SODIUM 70 MG PO TABS: ORAL_TABLET | ORAL | 3 refills | Status: DC

## 2019-03-20 MED ORDER — METFORMIN HCL 500 MG PO TABS: 500.0000 mg | ORAL_TABLET | Freq: Two times a day (BID) | ORAL | 3 refills | Status: DC

## 2019-03-20 NOTE — Addendum Note (Signed)
Addended by: Jacqualin Combes on: 03/20/2019 01:02 PM   Modules accepted: Orders

## 2019-03-20 NOTE — Patient Instructions (Addendum)
Start metformin 500 mg for diabetes. Start by taking 1 tablet in the morning with breakfast for two weeks, then increase to 1 tablet with breakfast and dinner thereafter.  Start atorvastatin 40 mg for cholesterol. Take this in the evening.  Stop by the lab prior to leaving today. I will notify you of your results once received.   Call the [facility] to schedule your mammogram and bone density scan.  Please schedule a follow up appointment in 3 months for diabetes and cholesterol check.  It was a pleasure to see you today!   Preventive Care 68 Years and Older, Female Preventive care refers to lifestyle choices and visits with your health care provider that can promote health and wellness. This includes:  A yearly physical exam. This is also called an annual well check.  Regular dental and eye exams.  Immunizations.  Screening for certain conditions.  Healthy lifestyle choices, such as diet and exercise. What can I expect for my preventive care visit? Physical exam Your health care provider will check:  Height and weight. These may be used to calculate body mass index (BMI), which is a measurement that tells if you are at a healthy weight.  Heart rate and blood pressure.  Your skin for abnormal spots. Counseling Your health care provider may ask you questions about:  Alcohol, tobacco, and drug use.  Emotional well-being.  Home and relationship well-being.  Sexual activity.  Eating habits.  History of falls.  Memory and ability to understand (cognition).  Work and work Statistician.  Pregnancy and menstrual history. What immunizations do I need?  Influenza (flu) vaccine  This is recommended every year. Tetanus, diphtheria, and pertussis (Tdap) vaccine  You may need a Td booster every 10 years. Varicella (chickenpox) vaccine  You may need this vaccine if you have not already been vaccinated. Zoster (shingles) vaccine  You may need this after  age 58. Pneumococcal conjugate (PCV13) vaccine  One dose is recommended after age 52. Pneumococcal polysaccharide (PPSV23) vaccine  One dose is recommended after age 38. Measles, mumps, and rubella (MMR) vaccine  You may need at least one dose of MMR if you were born in 1957 or later. You may also need a second dose. Meningococcal conjugate (MenACWY) vaccine  You may need this if you have certain conditions. Hepatitis A vaccine  You may need this if you have certain conditions or if you travel or work in places where you may be exposed to hepatitis A. Hepatitis B vaccine  You may need this if you have certain conditions or if you travel or work in places where you may be exposed to hepatitis B. Haemophilus influenzae type b (Hib) vaccine  You may need this if you have certain conditions. You may receive vaccines as individual doses or as more than one vaccine together in one shot (combination vaccines). Talk with your health care provider about the risks and benefits of combination vaccines. What tests do I need? Blood tests  Lipid and cholesterol levels. These may be checked every 5 years, or more frequently depending on your overall health.  Hepatitis C test.  Hepatitis B test. Screening  Lung cancer screening. You may have this screening every year starting at age 66 if you have a 30-pack-year history of smoking and currently smoke or have quit within the past 15 years.  Colorectal cancer screening. All adults should have this screening starting at age 29 and continuing until age 40. Your health care provider may recommend  screening at age 88 if you are at increased risk. You will have tests every 1-10 years, depending on your results and the type of screening test.  Diabetes screening. This is done by checking your blood sugar (glucose) after you have not eaten for a while (fasting). You may have this done every 1-3 years.  Mammogram. This may be done every 1-2 years. Talk  with your health care provider about how often you should have regular mammograms.  BRCA-related cancer screening. This may be done if you have a family history of breast, ovarian, tubal, or peritoneal cancers. Other tests  Sexually transmitted disease (STD) testing.  Bone density scan. This is done to screen for osteoporosis. You may have this done starting at age 62. Follow these instructions at home: Eating and drinking  Eat a diet that includes fresh fruits and vegetables, whole grains, lean protein, and low-fat dairy products. Limit your intake of foods with high amounts of sugar, saturated fats, and salt.  Take vitamin and mineral supplements as recommended by your health care provider.  Do not drink alcohol if your health care provider tells you not to drink.  If you drink alcohol: ? Limit how much you have to 0-1 drink a day. ? Be aware of how much alcohol is in your drink. In the [place] one drink equals one 12 oz bottle of beer (355 mL), one 5 oz glass of wine (148 mL), or one 1 oz glass of hard liquor (44 mL). Lifestyle  Take daily care of your teeth and gums.  Stay active. Exercise for at least 30 minutes on 5 or more days each week.  Do not use any products that contain nicotine or tobacco, such as cigarettes, e-cigarettes, and chewing tobacco. If you need help quitting, ask your health care provider.  If you are sexually active, practice safe sex. Use a condom or other form of protection in order to prevent STIs (sexually transmitted infections).  Talk with your health care provider about taking a low-dose aspirin or statin. What's next?  Go to your health care provider once a year for a well check visit.  Ask your health care provider how often you should have your eyes and teeth checked.  Stay up to date on all vaccines. This information is not intended to replace advice given to you by your health care provider. Make sure you discuss any questions you have with  your health care provider. Document Released: 06/28/2015 Document Revised: 05/26/2018 Document Reviewed: 05/26/2018 Elsevier Patient Education  2020 [employer].

## 2019-03-20 NOTE — Assessment & Plan Note (Addendum)
Increase in LDL from last year. Given new diagnosis of diabetes coupled with elevated LDL, will treat.  Rx for atorvastatin 40 mg sent to pharmacy. Repeat lipids and LFT's in 12 weeks.

## 2019-03-20 NOTE — Assessment & Plan Note (Signed)
Due for Prevnar. Declines influenza vaccination today. She will follow up on second Shingrix vaccination. Mammogram and bone density tests due and pending. Colonoscopy UTD. Commended her on a healthy diet, encouraged to increase exercise. Exam today unremarkable. Labs reviewed.

## 2019-03-20 NOTE — Progress Notes (Signed)
Subjective:    Patient ID: Kayla Ritter, female    DOB: May 02, 1951, 68 y.o.   MRN: [id number]  HPI  Kayla Ritter is a 68 year old female who presents today for complete physical and treatment of diabetes and hyperlipidemia.   Immunizations: -Tetanus: Completed in 2013 -Influenza: Due today, she declines  -Pneumonia: Completed in 2019, due today -Shingles: Completed first dose in February 2020, will return.   Diet: She endorses a healthy diet. Mostly eating lean protein, vegetables, fruit. Desserts once weekly. Drinking coffee, water, occasional wine. Exercise: She is walking daily.  Eye exam: Completed in 2019 Dental exam: Completes semi-annually  Colonoscopy: Completed in 2016, due in 2026 Mammogram: Due  Dexa: Completed last in 2018 Hep C Screen: Negative in 2019  BP Readings from Last 3 Encounters:  03/20/19 114/70  03/16/18 110/70  03/14/18 108/72      Review of Systems  Constitutional: Negative for unexpected weight change.  HENT: Negative for rhinorrhea.   Respiratory: Negative for cough and shortness of breath.   Cardiovascular: Negative for chest pain.  Gastrointestinal: Negative for constipation and diarrhea.       Increased belching and reflux symptoms over the last several weeks. No changes in diet. Compliant to omeprazole.   Genitourinary: Negative for difficulty urinating.  Musculoskeletal: Negative for arthralgias and myalgias.  Skin: Negative for rash.  Allergic/Immunologic: Negative for environmental allergies.  Neurological: Negative for dizziness, numbness and headaches.  Psychiatric/Behavioral: The patient is not nervous/anxious.        Past Medical History:  Diagnosis Date  . Chickenpox   . Genital warts   . GERD (gastroesophageal reflux disease)   . Urinary tract infection      Social History   Socioeconomic History  . Marital status: Married    Spouse name: Not on file  . Number of children: Not on file  . Years of education: Not on  file  . Highest education level: Not on file  Occupational History  . Not on file  Social Needs  . Financial resource strain: Not hard at all  . Food insecurity    Worry: Never true    Inability: Never true  . Transportation needs    Medical: No    Non-medical: No  Tobacco Use  . Smoking status: Former Research scientist (life sciences)  . Smokeless tobacco: Never Used  Substance and Sexual Activity  . Alcohol use: Yes    Comment: 1 glass of wine per month  . Drug use: Not Currently  . Sexual activity: Not on file  Lifestyle  . Physical activity    Days per week: 7 days    Minutes per session: 40 min  . Stress: Not at all  Relationships  . Social Herbalist on phone: Not on file    Gets together: Not on file    Attends religious service: Not on file    Active member of club or organization: Not on file    Attends meetings of clubs or organizations: Not on file    Relationship status: Not on file  . Intimate partner violence    Fear of current or ex partner: No    Emotionally abused: No    Physically abused: No    Forced sexual activity: No  Other Topics Concern  . Not on file  Social History Narrative   Married.   4 children, 5 grandchildren.    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    Enjoys  reading, crossword puzzles, spending time with grandchildren.     No past surgical history on file.  Family History  Problem Relation Age of Onset  . Arthritis Mother   . Diabetes Mother   . Hearing loss Mother   . Hypertension Mother   . Kidney cancer Mother   . Hearing loss Father   . Asthma Brother     Allergies  Allergen Reactions  . Codeine     REACTION: N \\T \ V    Current Outpatient Medications on File Prior to Visit  Medication Sig Dispense Refill  . alendronate (FOSAMAX) 70 MG tablet Take 1 tablet by mouth once weekly for osteoporosis. Do not lay down within 1 hour after taking. 12 tablet 3  . b complex vitamins capsule Take 1 capsule by mouth  daily.    Marland Kitchen CALCIUM PO Take 1,000 mg by mouth daily.    . Cholecalciferol (VITAMIN D PO) Take 1,000 Units by mouth daily.    Marland Kitchen omeprazole (PRILOSEC OTC) 20 MG tablet Take 20 mg by mouth daily.    . vitamin C (ASCORBIC ACID) 500 MG tablet Take 500 mg by mouth daily.    . vitamin E 400 UNIT capsule Take 400 Units by mouth daily.     No current facility-administered medications on file prior to visit.     BP 114/70   Pulse 61   Temp 97.7 F (36.5 C) (Temporal)   Ht 4' 10.5" (1.486 m)   Wt 124 lb 8 oz (56.5 kg)   SpO2 97%   BMI 25.58 kg/m    Objective:   Physical Exam  Constitutional: She is oriented to person, place, and time. She appears well-nourished.  HENT:  Right Ear: Tympanic membrane and ear canal normal.  Left Ear: Tympanic membrane and ear canal normal.  Mouth/Throat: Oropharynx is clear and moist.  Eyes: Pupils are equal, round, and reactive to light. EOM are normal.  Neck: Neck supple.  Cardiovascular: Normal rate and regular rhythm.  Respiratory: Effort normal and breath sounds normal.  GI: Soft. Bowel sounds are normal. There is no abdominal tenderness.  Musculoskeletal: Normal range of motion.  Neurological: She is alert and oriented to person, place, and time.  Skin: Skin is warm and dry.  Psychiatric: She has a normal mood and affect.           Assessment & Plan:

## 2019-03-20 NOTE — Assessment & Plan Note (Signed)
Compliant to alendronate for which she's been taking since 2018. Continue calcium and vitmain D, encouraged weight bearing exercise.   Continue alendronate until 2023. Repeat bone density scan pending.

## 2019-03-20 NOTE — Assessment & Plan Note (Signed)
Increased symptoms recently, will continue omeprazole and add in Pepcid daily. She will update.

## 2019-03-20 NOTE — Assessment & Plan Note (Signed)
A1C today of 6.5 which is an increase from last check of 6.2.   Discussed diagnosis of type 2 diabetes and treatment options. Given her overall healthy diet, family history of diabetes in mother, and hyperlipidemia she opts for treatment with Metformin.  Rx for Metformin sent to pharmacy. Discussed directions for administration.   Will add on statin therapy. Urine microalbumin pending today. Foot exam next visit. Prevnar provided.  Follow up in 3 months.

## 2019-05-18 ENCOUNTER — Ambulatory Visit (INDEPENDENT_AMBULATORY_CARE_PROVIDER_SITE_OTHER): Payer: PPO

## 2019-05-18 ENCOUNTER — Other Ambulatory Visit: Payer: Self-pay

## 2019-05-18 DIAGNOSIS — Z23 Encounter for immunization: Secondary | ICD-10-CM

## 2019-05-22 DIAGNOSIS — E119 Type 2 diabetes mellitus without complications: Secondary | ICD-10-CM

## 2019-05-23 MED ORDER — METFORMIN HCL ER 500 MG PO TB24: 500.0000 mg | ORAL_TABLET | Freq: Every day | ORAL | 3 refills | Status: DC

## 2019-06-20 ENCOUNTER — Ambulatory Visit: Payer: PPO | Admitting: Primary Care

## 2019-06-26 ENCOUNTER — Ambulatory Visit
Admission: RE | Admit: 2019-06-26 | Discharge: 2019-06-26 | Disposition: A | Discharge status: Home / Self Care | Payer: PPO | Source: Ambulatory Visit | Attending: Primary Care | Admitting: Primary Care

## 2019-06-26 DIAGNOSIS — Z1231 Encounter for screening mammogram for malignant neoplasm of breast: Secondary | ICD-10-CM | POA: Insufficient documentation

## 2019-06-26 DIAGNOSIS — E2839 Other primary ovarian failure: Secondary | ICD-10-CM | POA: Insufficient documentation

## 2019-06-26 DIAGNOSIS — Z780 Asymptomatic menopausal state: Secondary | ICD-10-CM | POA: Diagnosis not present

## 2019-06-26 DIAGNOSIS — M85852 Other specified disorders of bone density and structure, left thigh: Secondary | ICD-10-CM | POA: Diagnosis not present

## 2019-06-29 ENCOUNTER — Ambulatory Visit (INDEPENDENT_AMBULATORY_CARE_PROVIDER_SITE_OTHER): Payer: PPO | Admitting: Primary Care

## 2019-06-29 ENCOUNTER — Other Ambulatory Visit: Payer: Self-pay

## 2019-06-29 ENCOUNTER — Encounter: Payer: Self-pay | Admitting: Primary Care

## 2019-06-29 VITALS — BP 98/60 | HR 71 | Temp 97.6°F | Ht 58.5 in | Wt 122.8 lb

## 2019-06-29 DIAGNOSIS — M8580 Other specified disorders of bone density and structure, unspecified site: Secondary | ICD-10-CM

## 2019-06-29 DIAGNOSIS — Z8349 Family history of other endocrine, nutritional and metabolic diseases: Secondary | ICD-10-CM | POA: Diagnosis not present

## 2019-06-29 DIAGNOSIS — E119 Type 2 diabetes mellitus without complications: Secondary | ICD-10-CM

## 2019-06-29 DIAGNOSIS — E785 Hyperlipidemia, unspecified: Secondary | ICD-10-CM | POA: Diagnosis not present

## 2019-06-29 LAB — HEPATIC FUNCTION PANEL
ALT: 17 U/L (ref 0–35)
AST: 16 U/L (ref 0–37)
Albumin: 4.1 g/dL (ref 3.5–5.2)
Alkaline Phosphatase: 46 U/L (ref 39–117)
Bilirubin, Direct: 0.1 mg/dL (ref 0.0–0.3)
Total Bilirubin: 0.6 mg/dL (ref 0.2–1.2)
Total Protein: 6.8 g/dL (ref 6.0–8.3)

## 2019-06-29 LAB — LIPID PANEL
Cholesterol: 145 mg/dL (ref 0–200)
HDL: 51.5 mg/dL (ref >39.00)
LDL Cholesterol: 70 mg/dL (ref 0–99)
NonHDL: 93.77
Total CHOL/HDL Ratio: 3
Triglycerides: 120 mg/dL (ref 0.0–149.0)
VLDL: 24 mg/dL (ref 0.0–40.0)

## 2019-06-29 LAB — TSH: TSH: 1.82 u[IU]/mL (ref 0.35–4.50)

## 2019-06-29 LAB — HEMOGLOBIN A1C: Hgb A1c MFr Bld: 6 % (ref 4.6–6.5)

## 2019-06-29 NOTE — Patient Instructions (Addendum)
Stop by the lab prior to leaving today. I will notify you of your results once received.   Continue regular weight bearing exercise.  Continue calcium and vitamin D daily.  [phone] for Covid vaccine scheduling.   It was a pleasure to see you today!

## 2019-06-29 NOTE — Progress Notes (Signed)
Subjective:    Patient ID: Kayla Ritter, female    DOB: 05/21/1951, 69 y.o.   MRN: [id number]  HPI  This visit occurred during the SARS-CoV-2 public health emergency.  Safety protocols were in place, including screening questions prior to the visit, additional usage of staff PPE, and extensive cleaning of exam room while observing appropriate contact time as indicated for disinfecting solutions. She would also like her thyroid checked, family history of thyroid disease in her father.  Kayla Ritter is a 69 year old female who presents today for follow up.  1) Hyperlipidemia: She was last evaluated in early October 2020, LDL of 173. Given her increased and above goal LDL coupled with diabetes diagnosis we initiated atorvastatin 40 mg. Since her last visit she's compliant to atorvastatin, denies complications.  2) Type 2 Diabetes:  Current medications include: Metformin XR 500 mg daily. Tolerating much better than plain metformin.   Last A1C: 6.5 in October 2020, repeat A1C pending. Last Eye Exam: Due next week Last Foot Exam: Due today Pneumonia Vaccination: Completed last in 2019 ACE/ARB: none, urine microalbumin negative in October 2020 Statin: atorvastatin   BP Readings from Last 3 Encounters:  06/29/19 98/60  03/20/19 114/70  03/16/18 110/70   She endorses a fair diet, some walking during the week.  3) Osteoporosis/Osteopenia: Bone density from 2018 with T score of -2.5. she is compliant to calcium and vitamin D, tries to get weight bearing exercise when possible. Recent bone density scan with T score of -2.3.   Review of Systems  Respiratory: Negative for shortness of breath.   Cardiovascular: Negative for chest pain.  Gastrointestinal: Negative for diarrhea.  Musculoskeletal: Negative for myalgias.       Past Medical History:  Diagnosis Date  . Chickenpox   . Genital warts   . GERD (gastroesophageal reflux disease)   . Urinary tract infection      Social History    Socioeconomic History  . Marital status: Married    Spouse name: Not on file  . Number of children: Not on file  . Years of education: Not on file  . Highest education level: Not on file  Occupational History  . Not on file  Tobacco Use  . Smoking status: Former Research scientist (life sciences)  . Smokeless tobacco: Never Used  Substance and Sexual Activity  . Alcohol use: Yes    Comment: 1 glass of wine per month  . Drug use: Not Currently  . Sexual activity: Not on file  Other Topics Concern  . Not on file  Social History Narrative   Married.   4 children, 5 grandchildren.    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    Enjoys reading, crossword puzzles, spending time with grandchildren.    Social Determinants of Health   Financial Resource Strain: Low Risk   . Difficulty of Paying Living Expenses: Not hard at all  Food Insecurity: No Food Insecurity  . Worried About Charity fundraiser in the Last Year: Never true  . Ran Out of Food in the Last Year: Never true  Transportation Needs: No Transportation Needs  . Lack of Transportation (Medical): No  . Lack of Transportation (Non-Medical): No  Physical Activity: Sufficiently Active  . Days of Exercise per Week: 7 days  . Minutes of Exercise per Session: 40 min  Stress: No Stress Concern Present  . Feeling of Stress : Not at all  Social Connections:   . Frequency of Communication with  Friends and Family: Not on file  . Frequency of Social Gatherings with Friends and Family: Not on file  . Attends Religious Services: Not on file  . Active Member of Clubs or Organizations: Not on file  . Attends Archivist Meetings: Not on file  . Marital Status: Not on file  Intimate Partner Violence: Not At Risk  . Fear of Current or Ex-Partner: No  . Emotionally Abused: No  . Physically Abused: No  . Sexually Abused: No    History reviewed. No pertinent surgical history.  Family History  Problem Relation Age of Onset   . Arthritis Mother   . Diabetes Mother   . Hearing loss Mother   . Hypertension Mother   . Kidney cancer Mother   . Hearing loss Father   . Asthma Brother   . Breast cancer Neg Hx     Allergies  Allergen Reactions  . Codeine     REACTION: N \\T \ V    Current Outpatient Medications on File Prior to Visit  Medication Sig Dispense Refill  . alendronate (FOSAMAX) 70 MG tablet Take 1 tablet by mouth once weekly on an empty stomach with water for osteoporosis. Do not lay down within 1 hour after taking. 12 tablet 3  . atorvastatin (LIPITOR) 40 MG tablet Take 1 tablet (40 mg total) by mouth every evening. For cholesterol. 90 tablet 3  . b complex vitamins capsule Take 1 capsule by mouth daily.    Marland Kitchen CALCIUM PO Take 1,000 mg by mouth daily.    . Cholecalciferol (VITAMIN D PO) Take 1,000 Units by mouth daily.    . metFORMIN (GLUCOPHAGE-XR) 500 MG 24 hr tablet Take 1 tablet (500 mg total) by mouth daily with breakfast. For diabetes. 90 tablet 3  . omeprazole (PRILOSEC OTC) 20 MG tablet Take 20 mg by mouth daily.    . vitamin C (ASCORBIC ACID) 500 MG tablet Take 500 mg by mouth daily.    . vitamin E 400 UNIT capsule Take 400 Units by mouth daily.     No current facility-administered medications on file prior to visit.    BP 98/60   Pulse 71   Temp 97.6 F (36.4 C) (Temporal)   Ht 4' 10.5" (1.486 m)   Wt 122 lb 12.8 oz (55.7 kg)   SpO2 98%   BMI 25.23 kg/m    Objective:   Physical Exam  Constitutional: She appears well-nourished.  Cardiovascular: Normal rate and regular rhythm.  Respiratory: Effort normal and breath sounds normal.  Musculoskeletal:     Cervical back: Neck supple.  Skin: Skin is warm and dry.  Psychiatric: She has a normal mood and affect.           Assessment & Plan:

## 2019-06-29 NOTE — Assessment & Plan Note (Signed)
Recent Bone density scan improved compared to 2018. Continue calcium and vitamin D, weight bearing exercise. Repeat in 2 years.

## 2019-06-29 NOTE — Assessment & Plan Note (Signed)
Compliant to atorvastatin, no complications. Repeat lipids and LFT's pending.

## 2019-06-29 NOTE — Assessment & Plan Note (Signed)
Compliant to Metformin and doing better with XR version. Continue same.  Repeat A1C pending. Eye exam scheduled for next week. Foot exam today. Managed on statin. Urine microalbumin negative from October.  Follow up in 6-9 months based off of A1C.

## 2019-06-30 MED ORDER — ONETOUCH ULTRA VI STRP: ORAL_STRIP | 2 refills | Status: DC

## 2019-06-30 MED ORDER — ONETOUCH ULTRASOFT LANCETS MISC: 2 refills | Status: DC

## 2019-08-04 DIAGNOSIS — H5213 Myopia, bilateral: Secondary | ICD-10-CM | POA: Diagnosis not present

## 2019-08-04 DIAGNOSIS — H25043 Posterior subcapsular polar age-related cataract, bilateral: Secondary | ICD-10-CM | POA: Diagnosis not present

## 2019-08-04 DIAGNOSIS — H2513 Age-related nuclear cataract, bilateral: Secondary | ICD-10-CM | POA: Diagnosis not present

## 2019-08-04 DIAGNOSIS — H40013 Open angle with borderline findings, low risk, bilateral: Secondary | ICD-10-CM | POA: Diagnosis not present

## 2019-08-29 DIAGNOSIS — H2511 Age-related nuclear cataract, right eye: Secondary | ICD-10-CM | POA: Diagnosis not present

## 2019-08-29 DIAGNOSIS — H52201 Unspecified astigmatism, right eye: Secondary | ICD-10-CM | POA: Diagnosis not present

## 2019-08-29 DIAGNOSIS — H25811 Combined forms of age-related cataract, right eye: Secondary | ICD-10-CM | POA: Diagnosis not present

## 2019-08-29 DIAGNOSIS — H25041 Posterior subcapsular polar age-related cataract, right eye: Secondary | ICD-10-CM | POA: Diagnosis not present

## 2019-08-29 HISTORY — PX: CATARACT EXTRACTION: SUR2

## 2019-08-31 DIAGNOSIS — D225 Melanocytic nevi of trunk: Secondary | ICD-10-CM | POA: Diagnosis not present

## 2019-08-31 DIAGNOSIS — L820 Inflamed seborrheic keratosis: Secondary | ICD-10-CM | POA: Diagnosis not present

## 2019-08-31 DIAGNOSIS — D1801 Hemangioma of skin and subcutaneous tissue: Secondary | ICD-10-CM | POA: Diagnosis not present

## 2019-08-31 DIAGNOSIS — L814 Other melanin hyperpigmentation: Secondary | ICD-10-CM | POA: Diagnosis not present

## 2019-08-31 DIAGNOSIS — L821 Other seborrheic keratosis: Secondary | ICD-10-CM | POA: Diagnosis not present

## 2019-08-31 DIAGNOSIS — R208 Other disturbances of skin sensation: Secondary | ICD-10-CM | POA: Diagnosis not present

## 2019-09-19 DIAGNOSIS — H25812 Combined forms of age-related cataract, left eye: Secondary | ICD-10-CM | POA: Diagnosis not present

## 2019-09-19 DIAGNOSIS — H25042 Posterior subcapsular polar age-related cataract, left eye: Secondary | ICD-10-CM | POA: Diagnosis not present

## 2019-09-19 DIAGNOSIS — H52202 Unspecified astigmatism, left eye: Secondary | ICD-10-CM | POA: Diagnosis not present

## 2019-09-19 DIAGNOSIS — H2512 Age-related nuclear cataract, left eye: Secondary | ICD-10-CM | POA: Diagnosis not present

## 2019-09-19 HISTORY — PX: CATARACT EXTRACTION: SUR2

## 2020-01-03 DIAGNOSIS — H90A32 Mixed conductive and sensorineural hearing loss, unilateral, left ear with restricted hearing on the contralateral side: Secondary | ICD-10-CM | POA: Diagnosis not present

## 2020-01-08 MED ORDER — BLOOD GLUCOSE METER KIT: PACK | 0 refills | Status: DC

## 2020-01-11 ENCOUNTER — Encounter: Payer: Self-pay | Admitting: Primary Care

## 2020-01-11 ENCOUNTER — Ambulatory Visit (INDEPENDENT_AMBULATORY_CARE_PROVIDER_SITE_OTHER): Payer: PPO | Admitting: Primary Care

## 2020-01-11 ENCOUNTER — Other Ambulatory Visit: Payer: Self-pay

## 2020-01-11 VITALS — BP 120/76 | HR 68 | Temp 96.0°F | Ht 58.5 in | Wt 116.2 lb

## 2020-01-11 DIAGNOSIS — E119 Type 2 diabetes mellitus without complications: Secondary | ICD-10-CM

## 2020-01-11 LAB — POCT GLYCOSYLATED HEMOGLOBIN (HGB A1C): Hemoglobin A1C: 5.7 % — AB (ref 4.0–5.6)

## 2020-01-11 NOTE — Assessment & Plan Note (Signed)
Increased glucose readings over the last week, compliant to Metformin XR 500 mg. Has been working on diet and fasting recently.  A1C today of 5.7 which is actually an improvement form 6.0 in January 2021. We decided to stop her Metformin as she will continue to work on diet and exercise to control glucose readings.  We will repeat her A1C in early November 2021, she will update sooner if glucose readings remain elevated.

## 2020-01-11 NOTE — Progress Notes (Signed)
Subjective:    Patient ID: Kayla Ritter, female    DOB: [birth year], 69 y.o.   MRN: [id number]  HPI  This visit occurred during the SARS-CoV-2 public health emergency.  Safety protocols were in place, including screening questions prior to the visit, additional usage of staff PPE, and extensive cleaning of exam room while observing appropriate contact time as indicated for disinfecting solutions.   Kayla Ritter is a 69 year old female with a history of hyperlipidemia, type 2 diabetes, GERD who presents today to discuss diabetes.  Current medications include: Metformin XR 500 mg.   She has been checking her blood glucose 1 time daily over the last week in the morning before eating which has been running 180-200. She was on vacation for a few weeks in June and did not monitor glucose readings. She admits to not focusing on her diet during vacation.   In May she was checking glucose fasting in the morning which were running in the 130's.   She has since began intermittent fasting, changing her diet, without much improvement in glucose readings.   Last A1C: 6.0 in January 2021 Last Eye Exam: Completed in July 2021 Last Foot Exam: UTD Pneumonia Vaccination: ACE/ARB: None. Urine micro negative in October 2020 Statin: atorvastatin   BP Readings from Last 3 Encounters:  01/11/20 120/76  06/29/19 98/60  03/20/19 114/70    She denies fatigue, polyuria, polydipsia.     Review of Systems  Eyes: Negative for visual disturbance.  Respiratory: Negative for shortness of breath.   Cardiovascular: Negative for chest pain.  Endocrine: Negative for polydipsia, polyphagia and polyuria.  Neurological: Negative for dizziness and numbness.       Past Medical History:  Diagnosis Date  . Chickenpox   . Genital warts   . GERD (gastroesophageal reflux disease)   . Urinary tract infection      Social History   Socioeconomic History  . Marital status: Married    Spouse name: Not on file  .  Number of children: Not on file  . Years of education: Not on file  . Highest education level: Not on file  Occupational History  . Not on file  Tobacco Use  . Smoking status: Former Research scientist (life sciences)  . Smokeless tobacco: Never Used  Vaping Use  . Vaping Use: Never used  Substance and Sexual Activity  . Alcohol use: Yes    Comment: 1 glass of wine per month  . Drug use: Not Currently  . Sexual activity: Not on file  Other Topics Concern  . Not on file  Social History Narrative   Married.   4 children, 5 grandchildren.    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    Enjoys reading, crossword puzzles, spending time with grandchildren.    Social Determinants of Health   Financial Resource Strain: Low Risk   . Difficulty of Paying Living Expenses: Not hard at all  Food Insecurity: No Food Insecurity  . Worried About Charity fundraiser in the Last Year: Never true  . Ran Out of Food in the Last Year: Never true  Transportation Needs: No Transportation Needs  . Lack of Transportation (Medical): No  . Lack of Transportation (Non-Medical): No  Physical Activity: Sufficiently Active  . Days of Exercise per Week: 7 days  . Minutes of Exercise per Session: 40 min  Stress: No Stress Concern Present  . Feeling of Stress : Not at all  Social Connections:   .  Frequency of Communication with Friends and Family:   . Frequency of Social Gatherings with Friends and Family:   . Attends Religious Services:   . Active Member of Clubs or Organizations:   . Attends Archivist Meetings:   Marland Kitchen Marital Status:   Intimate Partner Violence: Not At Risk  . Fear of Current or Ex-Partner: No  . Emotionally Abused: No  . Physically Abused: No  . Sexually Abused: No    No past surgical history on file.  Family History  Problem Relation Age of Onset  . Arthritis Mother   . Diabetes Mother   . Hearing loss Mother   . Hypertension Mother   . Kidney cancer Mother   .  Hearing loss Father   . Asthma Brother   . Breast cancer Neg Hx     Allergies  Allergen Reactions  . Codeine     REACTION: N \T\ V    Current Outpatient Medications on File Prior to Visit  Medication Sig Dispense Refill  . alendronate (FOSAMAX) 70 MG tablet Take 1 tablet by mouth once weekly on an empty stomach with water for osteoporosis. Do not lay down within 1 hour after taking. 12 tablet 3  . atorvastatin (LIPITOR) 40 MG tablet Take 1 tablet (40 mg total) by mouth every evening. For cholesterol. 90 tablet 3  . blood glucose meter kit and supplies Dispense based on patient and insurance preference. Use up to 2 times daily as directed. (FOR ICD-10 E10.9, E11.9). 1 each 0  . CALCIUM PO Take 1,000 mg by mouth daily.    . Cholecalciferol (VITAMIN D PO) Take 1,000 Units by mouth daily.    Marland Kitchen glucose blood (ONETOUCH ULTRA) test strip Use as instructed 100 each 2  . Lancets (ONETOUCH ULTRASOFT) lancets Use as instructed 100 each 2  . omeprazole (PRILOSEC OTC) 20 MG tablet Take 20 mg by mouth daily.     No current facility-administered medications on file prior to visit.    BP 120/76   Pulse 68   Temp (!) 96 F (35.6 C) (Temporal)   Ht 4' 10.5" (1.486 m)   Wt 116 lb 4 oz (52.7 kg)   SpO2 98%   BMI 23.88 kg/m    Objective:   Physical Exam Cardiovascular:     Rate and Rhythm: Normal rate and regular rhythm.  Pulmonary:     Effort: Pulmonary effort is normal.     Breath sounds: Normal breath sounds.  Musculoskeletal:     Cervical back: Neck supple.  Skin:    General: Skin is warm and dry.            Assessment & Plan:

## 2020-01-11 NOTE — Patient Instructions (Signed)
Stop Metformin for diabetes.  Continue to work on a healthy diet and regular exercise.  Ensure you are consuming 64 ounces of water daily.  Please schedule a physical to meet with me in early November 2021.  It was a pleasure to see you today!

## 2020-01-30 DIAGNOSIS — H9113 Presbycusis, bilateral: Secondary | ICD-10-CM | POA: Diagnosis not present

## 2020-03-11 DIAGNOSIS — H40013 Open angle with borderline findings, low risk, bilateral: Secondary | ICD-10-CM | POA: Diagnosis not present

## 2020-03-11 DIAGNOSIS — Z961 Presence of intraocular lens: Secondary | ICD-10-CM | POA: Diagnosis not present

## 2020-03-15 ENCOUNTER — Other Ambulatory Visit: Payer: Self-pay | Admitting: Primary Care

## 2020-03-15 DIAGNOSIS — E785 Hyperlipidemia, unspecified: Secondary | ICD-10-CM

## 2020-03-19 ENCOUNTER — Other Ambulatory Visit: Payer: PPO

## 2020-03-19 ENCOUNTER — Ambulatory Visit: Payer: PPO

## 2020-03-22 ENCOUNTER — Encounter: Payer: PPO | Admitting: Primary Care

## 2020-04-11 ENCOUNTER — Other Ambulatory Visit: Payer: Self-pay | Admitting: Primary Care

## 2020-04-11 DIAGNOSIS — Z8349 Family history of other endocrine, nutritional and metabolic diseases: Secondary | ICD-10-CM

## 2020-04-11 DIAGNOSIS — E785 Hyperlipidemia, unspecified: Secondary | ICD-10-CM

## 2020-04-11 DIAGNOSIS — E119 Type 2 diabetes mellitus without complications: Secondary | ICD-10-CM

## 2020-04-19 ENCOUNTER — Other Ambulatory Visit (INDEPENDENT_AMBULATORY_CARE_PROVIDER_SITE_OTHER): Payer: PPO

## 2020-04-19 ENCOUNTER — Ambulatory Visit (INDEPENDENT_AMBULATORY_CARE_PROVIDER_SITE_OTHER): Payer: PPO

## 2020-04-19 ENCOUNTER — Other Ambulatory Visit: Payer: Self-pay

## 2020-04-19 DIAGNOSIS — Z0000 Encounter for general adult medical examination without abnormal findings: Secondary | ICD-10-CM | POA: Diagnosis not present

## 2020-04-19 DIAGNOSIS — E785 Hyperlipidemia, unspecified: Secondary | ICD-10-CM

## 2020-04-19 DIAGNOSIS — E119 Type 2 diabetes mellitus without complications: Secondary | ICD-10-CM

## 2020-04-19 DIAGNOSIS — Z8349 Family history of other endocrine, nutritional and metabolic diseases: Secondary | ICD-10-CM | POA: Diagnosis not present

## 2020-04-19 LAB — COMPREHENSIVE METABOLIC PANEL
ALT: 15 U/L (ref 0–35)
AST: 15 U/L (ref 0–37)
Albumin: 4 g/dL (ref 3.5–5.2)
Alkaline Phosphatase: 50 U/L (ref 39–117)
BUN: 15 mg/dL (ref 6–23)
CO2: 31 mEq/L (ref 19–32)
Calcium: 9.5 mg/dL (ref 8.4–10.5)
Chloride: 103 mEq/L (ref 96–112)
Creatinine, Ser: 0.86 mg/dL (ref 0.40–1.20)
GFR: 68.94 mL/min (ref >60.00)
Glucose, Bld: 96 mg/dL (ref 70–99)
Potassium: 4.2 mEq/L (ref 3.5–5.1)
Sodium: 141 mEq/L (ref 135–145)
Total Bilirubin: 0.6 mg/dL (ref 0.2–1.2)
Total Protein: 6.5 g/dL (ref 6.0–8.3)

## 2020-04-19 LAB — CBC
HCT: 36.2 % (ref 36.0–46.0)
Hemoglobin: 12 g/dL (ref 12.0–15.0)
MCHC: 33.2 g/dL (ref 30.0–36.0)
MCV: 85.2 fl (ref 78.0–100.0)
Platelets: 258 10*3/uL (ref 150.0–400.0)
RBC: 4.25 Mil/uL (ref 3.87–5.11)
RDW: 14.8 % (ref 11.5–15.5)
WBC: 4.6 10*3/uL (ref 4.0–10.5)

## 2020-04-19 LAB — LIPID PANEL
Cholesterol: 132 mg/dL (ref 0–200)
HDL: 52.7 mg/dL (ref >39.00)
LDL Cholesterol: 60 mg/dL (ref 0–99)
NonHDL: 78.94
Total CHOL/HDL Ratio: 2
Triglycerides: 96 mg/dL (ref 0.0–149.0)
VLDL: 19.2 mg/dL (ref 0.0–40.0)

## 2020-04-19 LAB — MICROALBUMIN / CREATININE URINE RATIO
Creatinine,U: 141 mg/dL
Microalb Creat Ratio: 0.6 mg/g (ref 0.0–30.0)
Microalb, Ur: 0.9 mg/dL (ref 0.0–1.9)

## 2020-04-19 LAB — TSH: TSH: 1.92 u[IU]/mL (ref 0.35–4.50)

## 2020-04-19 LAB — HEMOGLOBIN A1C: Hgb A1c MFr Bld: 6.6 % — ABNORMAL HIGH (ref 4.6–6.5)

## 2020-04-19 NOTE — Patient Instructions (Signed)
Kayla Ritter , Thank you for taking time to come for your Medicare Wellness Visit. I appreciate your ongoing commitment to your health goals. Please review the following plan we discussed and let me know if I can assist you in the future.   Screening recommendations/referrals: Colonoscopy: Up to date, completed 08/14/2014, due 08/2024 Mammogram: Up to date, completed 06/26/2019, due 06/2020 Bone Density: Up to date, completed 06/26/2019, due 06/2021 Recommended yearly ophthalmology/optometry visit for glaucoma screening and checkup Recommended yearly dental visit for hygiene and checkup  Vaccinations: Influenza vaccine: due, will get at next office visit  Pneumococcal vaccine: Completed series Tdap vaccine: Up to date, completed 06/16/2011, due 06/2021 Shingles vaccine: due, check with pharmacy regarding insurance coverage if interested Covid-19:Completed series  Advanced directives: Please bring a copy of your POA (Power of [place]) and/or Living Will to your next appointment.   Conditions/risks identified: diabetes, hyperlipidemia  Next appointment: Follow up in one year for your annual wellness visit    Preventive Care 69 Years and Older, Female Preventive care refers to lifestyle choices and visits with your health care provider that can promote health and wellness. What does preventive care include?  A yearly physical exam. This is also called an annual well check.  Dental exams once or twice a year.  Routine eye exams. Ask your health care provider how often you should have your eyes checked.  Personal lifestyle choices, including:  Daily care of your teeth and gums.  Regular physical activity.  Eating a healthy diet.  Avoiding tobacco and drug use.  Limiting alcohol use.  Practicing safe sex.  Taking low-dose aspirin every day.  Taking vitamin and mineral supplements as recommended by your health care provider. What happens during an annual well check? The services and  screenings done by your health care provider during your annual well check will depend on your age, overall health, lifestyle risk factors, and family history of disease. Counseling  Your health care provider may ask you questions about your:  Alcohol use.  Tobacco use.  Drug use.  Emotional well-being.  Home and relationship well-being.  Sexual activity.  Eating habits.  History of falls.  Memory and ability to understand (cognition).  Work and work Statistician.  Reproductive health. Screening  You may have the following tests or measurements:  Height, weight, and BMI.  Blood pressure.  Lipid and cholesterol levels. These may be checked every 5 years, or more frequently if you are over 25 years old.  Skin check.  Lung cancer screening. You may have this screening every year starting at age 94 if you have a 30-pack-year history of smoking and currently smoke or have quit within the past 15 years.  Fecal occult blood test (FOBT) of the stool. You may have this test every year starting at age 64.  Flexible sigmoidoscopy or colonoscopy. You may have a sigmoidoscopy every 5 years or a colonoscopy every 10 years starting at age 15.  Hepatitis C blood test.  Hepatitis B blood test.  Sexually transmitted disease (STD) testing.  Diabetes screening. This is done by checking your blood sugar (glucose) after you have not eaten for a while (fasting). You may have this done every 1-3 years.  Bone density scan. This is done to screen for osteoporosis. You may have this done starting at age 27.  Mammogram. This may be done every 1-2 years. Talk to your health care provider about how often you should have regular mammograms. Talk with your health care provider about  your test results, treatment options, and if necessary, the need for more tests. Vaccines  Your health care provider may recommend certain vaccines, such as:  Influenza vaccine. This is recommended every  year.  Tetanus, diphtheria, and acellular pertussis (Tdap, Td) vaccine. You may need a Td booster every 10 years.  Zoster vaccine. You may need this after age 49.  Pneumococcal 13-valent conjugate (PCV13) vaccine. One dose is recommended after age 76.  Pneumococcal polysaccharide (PPSV23) vaccine. One dose is recommended after age 23. Talk to your health care provider about which screenings and vaccines you need and how often you need them. This information is not intended to replace advice given to you by your health care provider. Make sure you discuss any questions you have with your health care provider. Document Released: 06/28/2015 Document Revised: 02/19/2016 Document Reviewed: 04/02/2015 Elsevier Interactive Patient Education  2017 [place] Prevention in the Home Falls can cause injuries. They can happen to people of all ages. There are many things you can do to make your home safe and to help prevent falls. What can I do on the outside of my home?  Regularly fix the edges of walkways and driveways and fix any cracks.  Remove anything that might make you trip as you walk through a door, such as a raised step or threshold.  Trim any bushes or trees on the path to your home.  Use bright outdoor lighting.  Clear any walking paths of anything that might make someone trip, such as rocks or tools.  Regularly check to see if handrails are loose or broken. Make sure that both sides of any steps have handrails.  Any raised decks and porches should have guardrails on the edges.  Have any leaves, snow, or ice cleared regularly.  Use sand or salt on walking paths during winter.  Clean up any spills in your garage right away. This includes oil or grease spills. What can I do in the bathroom?  Use night lights.  Install grab bars by the toilet and in the tub and shower. Do not use towel bars as grab bars.  Use non-skid mats or decals in the tub or shower.  If you  need to sit down in the shower, use a plastic, non-slip stool.  Keep the floor dry. Clean up any water that spills on the floor as soon as it happens.  Remove soap buildup in the tub or shower regularly.  Attach bath mats securely with double-sided non-slip rug tape.  Do not have throw rugs and other things on the floor that can make you trip. What can I do in the bedroom?  Use night lights.  Make sure that you have a light by your bed that is easy to reach.  Do not use any sheets or blankets that are too big for your bed. They should not hang down onto the floor.  Have a firm chair that has side arms. You can use this for support while you get dressed.  Do not have throw rugs and other things on the floor that can make you trip. What can I do in the kitchen?  Clean up any spills right away.  Avoid walking on wet floors.  Keep items that you use a lot in easy-to-reach places.  If you need to reach something above you, use a strong step stool that has a grab bar.  Keep electrical cords out of the way.  Do not use floor polish or wax  that makes floors slippery. If you must use wax, use non-skid floor wax.  Do not have throw rugs and other things on the floor that can make you trip. What can I do with my stairs?  Do not leave any items on the stairs.  Make sure that there are handrails on both sides of the stairs and use them. Fix handrails that are broken or loose. Make sure that handrails are as long as the stairways.  Check any carpeting to make sure that it is firmly attached to the stairs. Fix any carpet that is loose or worn.  Avoid having throw rugs at the top or bottom of the stairs. If you do have throw rugs, attach them to the floor with carpet tape.  Make sure that you have a light switch at the top of the stairs and the bottom of the stairs. If you do not have them, ask someone to add them for you. What else can I do to help prevent falls?  Wear shoes  that:  Do not have high heels.  Have rubber bottoms.  Are comfortable and fit you well.  Are closed at the toe. Do not wear sandals.  If you use a stepladder:  Make sure that it is fully opened. Do not climb a closed stepladder.  Make sure that both sides of the stepladder are locked into place.  Ask someone to hold it for you, if possible.  Clearly mark and make sure that you can see:  Any grab bars or handrails.  First and last steps.  Where the edge of each step is.  Use tools that help you move around (mobility aids) if they are needed. These include:  Canes.  Walkers.  Scooters.  Crutches.  Turn on the lights when you go into a dark area. Replace any light bulbs as soon as they burn out.  Set up your furniture so you have a clear path. Avoid moving your furniture around.  If any of your floors are uneven, fix them.  If there are any pets around you, be aware of where they are.  Review your medicines with your doctor. Some medicines can make you feel dizzy. This can increase your chance of falling. Ask your doctor what other things that you can do to help prevent falls. This information is not intended to replace advice given to you by your health care provider. Make sure you discuss any questions you have with your health care provider. Document Released: 03/28/2009 Document Revised: 11/07/2015 Document Reviewed: 07/06/2014 Elsevier Interactive Patient Education  2017 [employer].

## 2020-04-19 NOTE — Progress Notes (Signed)
Subjective:   Kayla Ritter is a 69 y.o. female who presents for Medicare Annual (Subsequent) preventive examination.  Review of Systems: N/A      I connected with the patient today by telephone and verified that I am speaking with the correct person using two identifiers. Location patient: home Location nurse: work Persons participating in the telephone visit: patient, nurse.   I discussed the limitations, risks, security and privacy concerns of performing an evaluation and management service by telephone and the availability of in person appointments. I also discussed with the patient that there may be a patient responsible charge related to this service. The patient expressed understanding and verbally consented to this telephonic visit.        Cardiac Risk Factors include: advanced age (>84men, >64 women);diabetes mellitus;Other (see comment), Risk factor comments: hyperlipidemia     Objective:    Today's Vitals   There is no height or weight on file to calculate BMI.  Advanced Directives 04/19/2020 03/17/2019 03/14/2018  Does Patient Have a Medical Advance Directive? Yes No Yes  Type of Paramedic of [place];Living will - Living will  Copy of New [place] in Chart? No - copy requested - -  Would patient like information on creating a medical advance directive? - No - Patient declined -    Current Medications (verified) Outpatient Encounter Medications as of 04/19/2020  Medication Sig  . alendronate (FOSAMAX) 70 MG tablet Take 1 tablet by mouth once weekly on an empty stomach with water for osteoporosis. Do not lay down within 1 hour after taking.  Marland Kitchen atorvastatin (LIPITOR) 40 MG tablet TAKE 1 TABLET (40 MG TOTAL) BY MOUTH EVERY EVENING. FOR CHOLESTEROL.  Marland Kitchen blood glucose meter kit and supplies Dispense based on patient and insurance preference. Use up to 2 times daily as directed. (FOR ICD-10 E10.9, E11.9).  . CALCIUM PO Take 1,000 mg by  mouth daily.  . Cholecalciferol (VITAMIN D PO) Take 1,000 Units by mouth daily.  Marland Kitchen glucose blood (ONETOUCH ULTRA) test strip Use as instructed  . Lancets (ONETOUCH ULTRASOFT) lancets Use as instructed  . omeprazole (PRILOSEC OTC) 20 MG tablet Take 20 mg by mouth daily.   No facility-administered encounter medications on file as of 04/19/2020.    Allergies (verified) Codeine   History: Past Medical History:  Diagnosis Date  . Chickenpox   . Genital warts   . GERD (gastroesophageal reflux disease)   . Urinary tract infection    Past Surgical History:  Procedure Laterality Date  . CATARACT EXTRACTION Right 08/29/2019  . CATARACT EXTRACTION Left 09/19/2019   Family History  Problem Relation Age of Onset  . Arthritis Mother   . Diabetes Mother   . Hearing loss Mother   . Hypertension Mother   . Kidney cancer Mother   . Hearing loss Father   . Asthma Brother   . Breast cancer Neg Hx    Social History   Socioeconomic History  . Marital status: Married    Spouse name: Not on file  . Number of children: Not on file  . Years of education: Not on file  . Highest education level: Not on file  Occupational History  . Not on file  Tobacco Use  . Smoking status: Former Research scientist (life sciences)  . Smokeless tobacco: Never Used  Vaping Use  . Vaping Use: Never used  Substance and Sexual Activity  . Alcohol use: Yes    Comment: 1 glass of wine per month  .  Drug use: Not Currently  . Sexual activity: Not on file  Other Topics Concern  . Not on file  Social History Narrative   Married.   4 children, 5 grandchildren.    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    Enjoys reading, crossword puzzles, spending time with grandchildren.    Social Determinants of Health   Financial Resource Strain: Low Risk   . Difficulty of Paying Living Expenses: Not hard at all  Food Insecurity: No Food Insecurity  . Worried About Charity fundraiser in the Last Year: Never true  .  Ran Out of Food in the Last Year: Never true  Transportation Needs: No Transportation Needs  . Lack of Transportation (Medical): No  . Lack of Transportation (Non-Medical): No  Physical Activity: Sufficiently Active  . Days of Exercise per Week: 7 days  . Minutes of Exercise per Session: 30 min  Stress: No Stress Concern Present  . Feeling of Stress : Not at all  Social Connections:   . Frequency of Communication with Friends and Family: Not on file  . Frequency of Social Gatherings with Friends and Family: Not on file  . Attends Religious Services: Not on file  . Active Member of Clubs or Organizations: Not on file  . Attends Archivist Meetings: Not on file  . Marital Status: Not on file    Tobacco Counseling Counseling given: Not Answered   Clinical Intake:  Pre-visit preparation completed: Yes  Pain : No/denies pain     Nutritional Risks: None Diabetes: Yes CBG done?: No Did pt. bring in CBG monitor from home?: No  How often do you need to have someone help you when you read instructions, pamphlets, or other written materials from your doctor or pharmacy?: 1 - Never What is the last grade level you completed in school?: some college  Diabetic: Yes Nutrition Risk Assessment:   Has the patient had any N/V/D within the last 2 months?  No  Does the patient have any non-healing wounds?  No  Has the patient had any unintentional weight loss or weight gain?  No   Diabetes:  Is the patient diabetic?  Yes  If diabetic, was a CBG obtained today?  No  Did the patient bring in their glucometer from home?  No, telephonic visit How often do you monitor your CBG's? never.   Financial Strains and Diabetes Management:  Are you having any financial strains with the device, your supplies or your medication? No .  Does the patient want to be seen by Chronic Care Management for management of their diabetes?  No  Would the patient like to be referred to a Nutritionist  or for Diabetic Management?  No   Diabetic Exams:  Diabetic Eye Exam: Completed 09/14/2019 Diabetic Foot Exam: Completed 06/29/2019   Interpreter Needed?: No  Information entered by :: CJohnson, LPN   Activities of Daily Living In your present state of health, do you have any difficulty performing the following activities: 04/19/2020  Hearing? N  Vision? N  Difficulty concentrating or making decisions? N  Walking or climbing stairs? N  Dressing or bathing? N  Doing errands, shopping? N  Preparing Food and eating ? N  Using the Toilet? N  In the past six months, have you accidently leaked urine? N  Do you have problems with loss of bowel control? N  Managing your Medications? N  Managing your Finances? N  Housekeeping or managing your Housekeeping?  N  Some recent data might be hidden    Patient Care Team: Pleas Koch, NP as PCP - General (Internal Medicine)  Indicate any recent Medical Services you may have received from other than [facility] providers in the past year (date may be approximate).     Assessment:   This is a routine wellness examination for Dawnelle.  Hearing/Vision screen  Hearing Screening   '125Hz'$  $Remo'250Hz'eCTkE$'500Hz'$'1000Hz'$'2000Hz'$'3000Hz'$'4000Hz'$'6000Hz'$'8000Hz'$   Right ear:           Left ear:           Vision Screening Comments: Patient gets annual eye exams   Dietary issues and exercise activities discussed: Current Exercise Habits: Home exercise routine, Type of exercise: walking, Time (Minutes): 30, Frequency (Times/Week): 7, Weekly Exercise (Minutes/Week): 210, Intensity: Moderate, Exercise limited by: None identified  Goals    . DIET - INCREASE WATER INTAKE     Starting 03/14/2018, I will continue to drink at least 6-8 glasses of water daily.    . Patient Stated     03/17/2019, Patient wants to maintain and continue medications as prescribed.     . Patient Stated     04/19/2020, I will continue to walk everyday for about 30 minutes.       Depression  Screen PHQ 2/9 Scores 04/19/2020 03/17/2019 03/14/2018  PHQ - 2 Score 0 0 0  PHQ- 9 Score 0 0 0    Fall Risk Fall Risk  04/19/2020 03/17/2019 03/14/2018  Falls in the past year? 0 0 No  Number falls in past yr: 0 - -  Injury with Fall? 0 - -  Risk for fall due to : No Fall Risks - -  Follow up Falls evaluation completed;Falls prevention discussed Falls evaluation completed;Falls prevention discussed -    Any stairs in or around the home? Yes  If so, are there any without handrails? No  Home free of loose throw rugs in walkways, pet beds, electrical cords, etc? Yes  Adequate lighting in your home to reduce risk of falls? Yes   ASSISTIVE DEVICES UTILIZED TO PREVENT FALLS:  Life alert? No  Use of a cane, walker or w/c? No  Grab bars in the bathroom? No  Shower chair or bench in shower? No  Elevated toilet seat or a handicapped toilet? No   TIMED UP AND GO:  Was the test performed? N/A, telephonic visit .   Cognitive Function: MMSE - Mini Mental State Exam 04/19/2020 03/17/2019 03/14/2018  Orientation to time $Remov'1 5 5  'fLKXDz$ Orientation to Place $Remove'5 5 5  'TPgWEHp$ Registration $Remov'3 3 3  'eSIgeP$ Attention/ Calculation 5 5 0  Recall $Remov'3 3 3  'rPamED$ Language- name 2 objects - - 0  Language- repeat $RemoveBeforeDE'1 1 1  'JFUJtaHjzCAByve$ Language- follow 3 step command - - 3  Language- read & follow direction - - 0  Write a sentence - - 0  Copy design - - 0  Total score - - 20  Mini Cog  Mini-Cog screen was completed. Maximum score is 22. A value of 0 denotes this part of the MMSE was not completed or the patient failed this part of the Mini-Cog screening.       Immunizations Immunization History  Administered Date(s) Administered  . Fluad Quad(high Dose 65+) 05/18/2019  . Influenza,inj,Quad PF,6+ Mos 03/16/2018  . PFIZER SARS-COV-2 Vaccination 07/28/2019, 08/22/2019  . Pneumococcal Conjugate-13 03/20/2019  . Pneumococcal Polysaccharide-23 03/16/2018  . Td 06/16/2011  . Zoster Recombinat (Shingrix) 08/13/2018  TDAP status: Up to date Flu  Vaccine status: due, will get at next office visit  Pneumococcal vaccine status: Up to date Covid-19 vaccine status: Completed vaccines  Qualifies for Shingles Vaccine? Yes   Zostavax completed No   Shingrix Completed?: No.    Education has been provided regarding the importance of this vaccine. Patient has been advised to call insurance company to determine out of pocket expense if they have not yet received this vaccine. Advised may also receive vaccine at local pharmacy or Health Dept. Verbalized acceptance and understanding.  Screening Tests Health Maintenance  Topic Date Due  . INFLUENZA VACCINE  01/14/2020  . URINE MICROALBUMIN  03/19/2020  . FOOT EXAM  06/28/2020  . HEMOGLOBIN A1C  07/13/2020  . OPHTHALMOLOGY EXAM  09/13/2020  . TETANUS/TDAP  06/15/2021  . MAMMOGRAM  06/25/2021  . COLONOSCOPY  08/13/2024  . DEXA SCAN  Completed  . COVID-19 Vaccine  Completed  . Hepatitis C Screening  Completed  . PNA vac Low Risk Adult  Completed    Health Maintenance  Health Maintenance Due  Topic Date Due  . INFLUENZA VACCINE  01/14/2020  . URINE MICROALBUMIN  03/19/2020    Colorectal cancer screening: Completed 08/14/2014. Repeat every 10 years Mammogram status: Completed 06/26/2019. Repeat every year Bone Density status: Completed 06/26/2019. Results reflect: Bone density results: OSTEOPENIA. Repeat every 2 years.  Lung Cancer Screening: (Low Dose CT Chest recommended if Age 51-80 years, 30 pack-year currently smoking OR have quit w/in 15years.) does not qualify.   Additional Screening:  Hepatitis C Screening: does qualify; Completed 03/14/2018  Vision Screening: Recommended annual ophthalmology exams for early detection of glaucoma and other disorders of the eye. Is the patient up to date with their annual eye exam?  Yes  Who is the provider or what is the name of the office in which the patient attends annual eye exams? Dr. Prudencio Burly, [facility] If pt is not established  with a provider, would they like to be referred to a provider to establish care? No .   Dental Screening: Recommended annual dental exams for proper oral hygiene  Community Resource Referral / Chronic Care Management: CRR required this visit?  No   CCM required this visit?  No      Plan:     I have personally reviewed and noted the following in the patient's chart:   . Medical and social history . Use of alcohol, tobacco or illicit drugs  . Current medications and supplements . Functional ability and status . Nutritional status . Physical activity . Advanced directives . List of other physicians . Hospitalizations, surgeries, and ER visits in previous 12 months . Vitals . Screenings to include cognitive, depression, and falls . Referrals and appointments  In addition, I have reviewed and discussed with patient certain preventive protocols, quality metrics, and best practice recommendations. A written personalized care plan for preventive services as well as general preventive health recommendations were provided to patient.   Due to this being a telephonic visit, the after visit summary with patients personalized plan was offered to patient via office or my-chart. Patient preferred to pick up at office at next visit or via mychart.   Andrez Grime, LPN   [id number]

## 2020-04-19 NOTE — Progress Notes (Signed)
PCP notes:  Health Maintenance: Flu- due   Abnormal Screenings: none   Patient concerns: Joint pain    Nurse concerns: none   Next PCP appt: 04/24/2020 @ 8 am

## 2020-04-24 ENCOUNTER — Ambulatory Visit (INDEPENDENT_AMBULATORY_CARE_PROVIDER_SITE_OTHER): Payer: PPO | Admitting: Primary Care

## 2020-04-24 ENCOUNTER — Other Ambulatory Visit: Payer: Self-pay

## 2020-04-24 ENCOUNTER — Encounter: Payer: Self-pay | Admitting: Primary Care

## 2020-04-24 VITALS — BP 118/74 | HR 81 | Temp 97.6°F | Ht 59.0 in | Wt 117.0 lb

## 2020-04-24 DIAGNOSIS — K219 Gastro-esophageal reflux disease without esophagitis: Secondary | ICD-10-CM

## 2020-04-24 DIAGNOSIS — E785 Hyperlipidemia, unspecified: Secondary | ICD-10-CM | POA: Diagnosis not present

## 2020-04-24 DIAGNOSIS — R110 Nausea: Secondary | ICD-10-CM | POA: Diagnosis not present

## 2020-04-24 DIAGNOSIS — E119 Type 2 diabetes mellitus without complications: Secondary | ICD-10-CM | POA: Diagnosis not present

## 2020-04-24 DIAGNOSIS — M8580 Other specified disorders of bone density and structure, unspecified site: Secondary | ICD-10-CM | POA: Diagnosis not present

## 2020-04-24 DIAGNOSIS — Z23 Encounter for immunization: Secondary | ICD-10-CM | POA: Diagnosis not present

## 2020-04-24 DIAGNOSIS — S90229A Contusion of unspecified lesser toe(s) with damage to nail, initial encounter: Secondary | ICD-10-CM | POA: Insufficient documentation

## 2020-04-24 DIAGNOSIS — Z0000 Encounter for general adult medical examination without abnormal findings: Secondary | ICD-10-CM | POA: Diagnosis not present

## 2020-04-24 DIAGNOSIS — S90222A Contusion of left lesser toe(s) with damage to nail, initial encounter: Secondary | ICD-10-CM

## 2020-04-24 MED ORDER — ZOSTER VAC RECOMB ADJUVANTED 50 MCG/0.5ML IM SUSR: 0.5000 mL | Freq: Once | INTRAMUSCULAR | 0 refills | Status: AC

## 2020-04-24 NOTE — Progress Notes (Signed)
Subjective:    Patient ID: Kayla Ritter, female    DOB: [birth year], 69 y.o.   MRN: [id number]  HPI  This visit occurred during the SARS-CoV-2 public health emergency.  Safety protocols were in place, including screening questions prior to the visit, additional usage of staff PPE, and extensive cleaning of exam room while observing appropriate contact time as indicated for disinfecting solutions.   Kayla Ritter is a 69 year old female who presents today for complete physical.  She has noticed intermittent upper abdominal symptoms including increased gas, nausea occurring only during meals. She denies LUQ abdominal pain but does notice the gas to the LUQ side. She's compliant to her omeprazole 20 mg daily, she does take Tums intermittently for the symptoms with improvement. She denies constipation and diarrhea. She does have known hemorrhoids with bright red bleeding at times, no changes.  She has not kept a food journal, and therefore does not aware of certain triggers for her symptoms.  She would like a food allergy test completed today.  She has also noticed dark purple discoloration under the left great toenail for the last several weeks.  She denies injury/trauma to the toe, pain, numbness.  Immunizations: -Tetanus: Completed in 2013 -Influenza: Due today -Shingles: Completed one dose. -Pneumonia: Completed Prevnar in 2020, Pneumovax in 2019 -Covid-19: Completed series   Diet: She endorses a healthy diet.  Exercise: She is walking most days.   Eye exam: Underwent cataract procedure  Dental exam: Completes semi-annually   Mammogram: Completed in 2021 Dexa: Completed in 2021, compliant to calcium and vitamin D. Colonoscopy: Completed within 10 years per patient.  We do not have these records. Hep C Screen: Negative   BP Readings from Last 3 Encounters:  04/24/20 118/74  01/11/20 120/76  06/29/19 98/60     Review of Systems  Constitutional: Negative for unexpected weight change.    HENT: Negative for rhinorrhea.   Eyes: Negative for visual disturbance.  Respiratory: Negative for cough and shortness of breath.   Cardiovascular: Negative for chest pain.  Gastrointestinal: Negative for constipation and diarrhea.       See HPI  Genitourinary: Negative for difficulty urinating.  Musculoskeletal: Positive for arthralgias. Negative for joint swelling, myalgias and neck stiffness.       Right first digit metatarsal pain x 2 weeks.  Skin: Negative for color change and rash.  Allergic/Immunologic: Negative for environmental allergies.  Neurological: Negative for dizziness and headaches.  Psychiatric/Behavioral: The patient is not nervous/anxious.        Past Medical History:  Diagnosis Date  . Chickenpox   . Genital warts   . GERD (gastroesophageal reflux disease)   . Urinary tract infection      Social History   Socioeconomic History  . Marital status: Married    Spouse name: Not on file  . Number of children: Not on file  . Years of education: Not on file  . Highest education level: Not on file  Occupational History  . Not on file  Tobacco Use  . Smoking status: Former Research scientist (life sciences)  . Smokeless tobacco: Never Used  Vaping Use  . Vaping Use: Never used  Substance and Sexual Activity  . Alcohol use: Yes    Comment: 1 glass of wine per month  . Drug use: Not Currently  . Sexual activity: Not on file  Other Topics Concern  . Not on file  Social History Narrative   Married.   4 children, 5 grandchildren.    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    Enjoys reading, crossword puzzles, spending time with grandchildren.    Social Determinants of Health   Financial Resource Strain: Low Risk   . Difficulty of Paying Living Expenses: Not hard at all  Food Insecurity: No Food Insecurity  . Worried About Charity fundraiser in the Last Year: Never true  . Ran Out of Food in the Last Year: Never true  Transportation Needs: No  Transportation Needs  . Lack of Transportation (Medical): No  . Lack of Transportation (Non-Medical): No  Physical Activity: Sufficiently Active  . Days of Exercise per Week: 7 days  . Minutes of Exercise per Session: 30 min  Stress: No Stress Concern Present  . Feeling of Stress : Not at all  Social Connections:   . Frequency of Communication with Friends and Family: Not on file  . Frequency of Social Gatherings with Friends and Family: Not on file  . Attends Religious Services: Not on file  . Active Member of Clubs or Organizations: Not on file  . Attends Archivist Meetings: Not on file  . Marital Status: Not on file  Intimate Partner Violence: Not At Risk  . Fear of Current or Ex-Partner: No  . Emotionally Abused: No  . Physically Abused: No  . Sexually Abused: No    Past Surgical History:  Procedure Laterality Date  . CATARACT EXTRACTION Right 08/29/2019  . CATARACT EXTRACTION Left 09/19/2019    Family History  Problem Relation Age of Onset  . Arthritis Mother   . Diabetes Mother   . Hearing loss Mother   . Hypertension Mother   . Kidney cancer Mother   . Hearing loss Father   . Asthma Brother   . Breast cancer Neg Hx     Allergies  Allergen Reactions  . Codeine     REACTION: N \T\ V    Current Outpatient Medications on File Prior to Visit  Medication Sig Dispense Refill  . alendronate (FOSAMAX) 70 MG tablet Take 1 tablet by mouth once weekly on an empty stomach with water for osteoporosis. Do not lay down within 1 hour after taking. 12 tablet 3  . atorvastatin (LIPITOR) 40 MG tablet TAKE 1 TABLET (40 MG TOTAL) BY MOUTH EVERY EVENING. FOR CHOLESTEROL. 90 tablet 3  . blood glucose meter kit and supplies Dispense based on patient and insurance preference. Use up to 2 times daily as directed. (FOR ICD-10 E10.9, E11.9). 1 each 0  . CALCIUM PO Take 1,000 mg by mouth daily.    . Cholecalciferol (VITAMIN D PO) Take 1,000 Units by mouth daily.    Marland Kitchen glucose  blood (ONETOUCH ULTRA) test strip Use as instructed 100 each 2  . Lancets (ONETOUCH ULTRASOFT) lancets Use as instructed 100 each 2  . omeprazole (PRILOSEC OTC) 20 MG tablet Take 20 mg by mouth daily.     No current facility-administered medications on file prior to visit.    BP 118/74   Pulse 81   Temp 97.6 F (36.4 C) (Temporal)   Ht _0  (1.499 m)   Wt 117 lb (53.1 kg)   SpO2 100%   BMI 23.63 kg/m    Objective:   Physical Exam HENT:     Right Ear: Tympanic membrane and ear canal normal.     Left Ear: Tympanic membrane and ear canal normal.  Eyes:     Pupils: Pupils are equal, round, and reactive to light.  Cardiovascular:  Rate and Rhythm: Normal rate and regular rhythm.     Pulses:          Dorsalis pedis pulses are 2+ on the left side.       Posterior tibial pulses are 2+ on the left side.  Pulmonary:     Effort: Pulmonary effort is normal.     Breath sounds: Normal breath sounds.  Abdominal:     General: Bowel sounds are normal.     Palpations: Abdomen is soft.     Tenderness: There is no abdominal tenderness.  Musculoskeletal:        General: Normal range of motion.     Cervical back: Neck supple.  Skin:    General: Skin is warm and dry.  Neurological:     Mental Status: She is alert and oriented to person, place, and time.     Cranial Nerves: No cranial nerve deficit.     Deep Tendon Reflexes:     Reflex Scores:      Patellar reflexes are 2+ on the right side and 2+ on the left side.           Assessment & Plan:

## 2020-04-24 NOTE — Assessment & Plan Note (Signed)
Prescription provided for second Shingrix vaccine as she never completed the series.  Other immunizations up-to-date, influenza vaccine provided today. Mammogram up-to-date. Density scan up-to-date. Colonoscopy date unclear.  We will try to obtain records from [place].  Encouraged a healthy diet regular exercise. Exam today as noted. Labs reviewed and also pending.

## 2020-04-24 NOTE — Assessment & Plan Note (Signed)
Without trauma/injury. Exam today consistent for bruising underneath the nailbed of left great toe.  If the bruising does not abate or grow out with toenail, then consider podiatry evaluation.  She agrees.

## 2020-04-24 NOTE — Patient Instructions (Addendum)
Continue exercising. You should be getting 150 minutes of moderate intensity exercise weekly.  Continue to work on a healthy diet. Ensure you are consuming 64 ounces of water daily.  Increase your omeprazole to 20 mg twice daily or 40 mg once daily. Please update me in a few weeks.  Take the shingles vaccine to your pharmacy for administration.  Stop by the lab prior to leaving today. I will notify you of your results once received.   Schedule a lab appointment for 3 months to repeat your A1C.  It was a pleasure to see you today!   Diabetes Mellitus and Nutrition, Adult When you have diabetes (diabetes mellitus), it is very important to have healthy eating habits because your blood sugar (glucose) levels are greatly affected by what you eat and drink. Eating healthy foods in the appropriate amounts, at about the same times every day, can help you:  Control your blood glucose.  Lower your risk of heart disease.  Improve your blood pressure.  Reach or maintain a healthy weight. Every person with diabetes is different, and each person has different needs for a meal plan. Your health care provider may recommend that you work with a diet and nutrition specialist (dietitian) to make a meal plan that is best for you. Your meal plan may vary depending on factors such as:  The calories you need.  The medicines you take.  Your weight.  Your blood glucose, blood pressure, and cholesterol levels.  Your activity level.  Other health conditions you have, such as heart or kidney disease. How do carbohydrates affect me? Carbohydrates, also called carbs, affect your blood glucose level more than any other type of food. Eating carbs naturally raises the amount of glucose in your blood. Carb counting is a method for keeping track of how many carbs you eat. Counting carbs is important to keep your blood glucose at a healthy level, especially if you use insulin or take certain oral diabetes  medicines. It is important to know how many carbs you can safely have in each meal. This is different for every person. Your dietitian can help you calculate how many carbs you should have at each meal and for each snack. Foods that contain carbs include:  Bread, cereal, rice, pasta, and crackers.  Potatoes and corn.  Peas, beans, and lentils.  Milk and yogurt.  Fruit and juice.  Desserts, such as cakes, cookies, ice cream, and candy. How does alcohol affect me? Alcohol can cause a sudden decrease in blood glucose (hypoglycemia), especially if you use insulin or take certain oral diabetes medicines. Hypoglycemia can be a life-threatening condition. Symptoms of hypoglycemia (sleepiness, dizziness, and confusion) are similar to symptoms of having too much alcohol. If your health care provider says that alcohol is safe for you, follow these guidelines:  Limit alcohol intake to no more than 1 drink per day for nonpregnant women and 2 drinks per day for men. One drink equals 12 oz of beer, 5 oz of wine, or 1 oz of hard liquor.  Do not drink on an empty stomach.  Keep yourself hydrated with water, diet soda, or unsweetened iced tea.  Keep in mind that regular soda, juice, and other mixers may contain a lot of sugar and must be counted as carbs. What are tips for following this plan?  Reading food labels  Start by checking the serving size on the "Nutrition Facts" label of packaged foods and drinks. The amount of calories, carbs, fats, and other  nutrients listed on the label is based on one serving of the item. Many items contain more than one serving per package.  Check the total grams (g) of carbs in one serving. You can calculate the number of servings of carbs in one serving by dividing the total carbs by 15. For example, if a food has 30 g of total carbs, it would be equal to 2 servings of carbs.  Check the number of grams (g) of saturated and trans fats in one serving. Choose foods  that have low or no amount of these fats.  Check the number of milligrams (mg) of salt (sodium) in one serving. Most people should limit total sodium intake to less than 2,300 mg per day.  Always check the nutrition information of foods labeled as "low-fat" or "nonfat". These foods may be higher in added sugar or refined carbs and should be avoided.  Talk to your dietitian to identify your daily goals for nutrients listed on the label. Shopping  Avoid buying canned, premade, or processed foods. These foods tend to be high in fat, sodium, and added sugar.  Shop around the outside edge of the grocery store. This includes fresh fruits and vegetables, bulk grains, fresh meats, and fresh dairy. Cooking  Use low-heat cooking methods, such as baking, instead of high-heat cooking methods like deep frying.  Cook using healthy oils, such as olive, canola, or sunflower oil.  Avoid cooking with butter, cream, or high-fat meats. Meal planning  Eat meals and snacks regularly, preferably at the same times every day. Avoid going long periods of time without eating.  Eat foods high in fiber, such as fresh fruits, vegetables, beans, and whole grains. Talk to your dietitian about how many servings of carbs you can eat at each meal.  Eat 4-6 ounces (oz) of lean protein each day, such as lean meat, chicken, fish, eggs, or tofu. One oz of lean protein is equal to: ? 1 oz of meat, chicken, or fish. ? 1 egg. ?  cup of tofu.  Eat some foods each day that contain healthy fats, such as avocado, nuts, seeds, and fish. Lifestyle  Check your blood glucose regularly.  Exercise regularly as told by your health care provider. This may include: ? 150 minutes of moderate-intensity or vigorous-intensity exercise each week. This could be brisk walking, biking, or water aerobics. ? Stretching and doing strength exercises, such as yoga or weightlifting, at least 2 times a week.  Take medicines as told by your  health care provider.  Do not use any products that contain nicotine or tobacco, such as cigarettes and e-cigarettes. If you need help quitting, ask your health care provider.  Work with a Social worker or diabetes educator to identify strategies to manage stress and any emotional and social challenges. Questions to ask a health care provider  Do I need to meet with a diabetes educator?  Do I need to meet with a dietitian?  What number can I call if I have questions?  When are the best times to check my blood glucose? Where to find more information:  American Diabetes Association: diabetes.org  Academy of Nutrition and Dietetics: www.eatright.[employer] of Diabetes and Digestive and Kidney Diseases (NIH): [employer] Summary  A healthy meal plan will help you control your blood glucose and maintain a healthy lifestyle.  Working with a diet and nutrition specialist (dietitian) can help you make a meal plan that is best for you.  Keep in mind that carbohydrates (  carbs) and alcohol have immediate effects on your blood glucose levels. It is important to count carbs and to use alcohol carefully. This information is not intended to replace advice given to you by your health care provider. Make sure you discuss any questions you have with your health care provider. Document Revised: 05/14/2017 Document Reviewed: 07/06/2016 Elsevier Patient Education  [place].     Influenza (Flu) Vaccine (Inactivated or Recombinant): What You Need to Know 1. Why get vaccinated? Influenza vaccine can prevent influenza (flu). Flu is a contagious disease that spreads around the [place] every year, usually between October and May. Anyone can get the flu, but it is more dangerous for some people. Infants and young children, people 54 years of age and older, pregnant women, and people with certain health conditions or a weakened immune system are at greatest risk of flu  complications. Pneumonia, bronchitis, sinus infections and ear infections are examples of flu-related complications. If you have a medical condition, such as heart disease, cancer or diabetes, flu can make it worse. Flu can cause fever and chills, sore throat, muscle aches, fatigue, cough, headache, and runny or stuffy nose. Some people may have vomiting and diarrhea, though this is more common in children than adults. Each year thousands of people in the [place] die from flu, and many more are hospitalized. Flu vaccine prevents millions of illnesses and flu-related visits to the doctor each year. 2. Influenza vaccine CDC recommends everyone 28 months of age and older get vaccinated every flu season. Children 6 months through 33 years of age may need 2 doses during a single flu season. Everyone else needs only 1 dose each flu season. It takes about 2 weeks for protection to develop after vaccination. There are many flu viruses, and they are always changing. Each year a new flu vaccine is made to protect against three or four viruses that are likely to cause disease in the upcoming flu season. Even when the vaccine doesn't exactly match these viruses, it may still provide some protection. Influenza vaccine does not cause flu. Influenza vaccine may be given at the same time as other vaccines. 3. Talk with your health care provider Tell your vaccine provider if the person getting the vaccine:  Has had an allergic reaction after a previous dose of influenza vaccine, or has any severe, life-threatening allergies.  Has ever had Guillain-Barr Syndrome (also called GBS). In some cases, your health care provider may decide to postpone influenza vaccination to a future visit. People with minor illnesses, such as a cold, may be vaccinated. People who are moderately or severely ill should usually wait until they recover before getting influenza vaccine. Your health care provider can give you more  information. 4. Risks of a vaccine reaction  Soreness, redness, and swelling where shot is given, fever, muscle aches, and headache can happen after influenza vaccine.  There may be a very small increased risk of Guillain-Barr Syndrome (GBS) after inactivated influenza vaccine (the flu shot). Young children who get the flu shot along with pneumococcal vaccine (PCV13), and/or DTaP vaccine at the same time might be slightly more likely to have a seizure caused by fever. Tell your health care provider if a child who is getting flu vaccine has ever had a seizure. People sometimes faint after medical procedures, including vaccination. Tell your provider if you feel dizzy or have vision changes or ringing in the ears. As with any medicine, there is a very remote chance of a  vaccine causing a severe allergic reaction, other serious injury, or death. 5. What if there is a serious problem? An allergic reaction could occur after the vaccinated person leaves the clinic. If you see signs of a severe allergic reaction (hives, swelling of the face and throat, difficulty breathing, a fast heartbeat, dizziness, or weakness), call 9-1-1 and get the person to the nearest hospital. For other signs that concern you, call your health care provider. Adverse reactions should be reported to the Vaccine Adverse Event Reporting System (VAERS). Your health care provider will usually file this report, or you can do it yourself. Visit the VAERS website at www.vaers.SamedayNews.es or call (732) 763-0056.VAERS is only for reporting reactions, and VAERS staff do not give medical advice. 6. The National Vaccine Injury Compensation Program The [employer] Vaccine Injury Compensation Program (VICP) is a federal program that was created to compensate people who may have been injured by certain vaccines. Visit the VICP website at GoldCloset.com.ee or call 660-161-4709 to learn about the program and about filing a claim. There is a  time limit to file a claim for compensation. 7. How can I learn more?  Ask your healthcare provider.  Call your local or state health department.  Contact the Centers for Disease Control and Prevention (CDC): ? Call 475 101 0929 (1-800-CDC-INFO) or ? Visit CDC's https://gibson.com/ Vaccine Information Statement (Interim) Inactivated Influenza Vaccine (01/27/2018) This information is not intended to replace advice given to you by your health care provider. Make sure you discuss any questions you have with your health care provider. Document Revised: 09/20/2018 Document Reviewed: 01/31/2018 Elsevier Patient Education  [place].

## 2020-04-24 NOTE — Assessment & Plan Note (Signed)
Worsened since off Metformin, but overall still stable for age range.  Discussed options for treatment, she would like to remain off of Metformin due to GI side effects and hold off other medication treatment while she works on improving her diet.  We will repeat her A1c in 3 months, if A1c above 7 then consider low-dose glipizide.

## 2020-04-24 NOTE — Assessment & Plan Note (Signed)
Well-controlled on atorvastatin 40 mg. Continue same.

## 2020-04-24 NOTE — Assessment & Plan Note (Signed)
Compliant to alendronate, calcium and vitamin D, daily walking.  Recent bone density test with improvement overall.  Continue current regimen. Repeat bone density test in 2 years.

## 2020-04-24 NOTE — Assessment & Plan Note (Signed)
Unclear if abdominal symptoms are secondary to GERD, but given improvement with Tums it is more likely.  Start with increased dose of omeprazole to 20 mg twice daily, or 40 mg once daily.  She has plenty of omeprazole at home.  If no improvement in upper abdominal symptoms, then consider evaluation for H. Pylori.  We will also check food sensitivity panel per patient request.

## 2020-04-25 LAB — FOOD ALLERGY PROFILE
Allergen, Salmon, f41: <0.1 kU/L
Almonds: <0.1 kU/L
CLASS: 0
CLASS: 0
CLASS: 0
CLASS: 0
CLASS: 0
CLASS: 0
CLASS: 0
CLASS: 0
CLASS: 0
CLASS: 0
CLASS: 0
Cashew IgE: <0.1 kU/L
Class: 0
Class: 0
Class: 0
Class: 0
Egg White IgE: <0.1 kU/L
Fish Cod: <0.1 kU/L
Hazelnut: <0.1 kU/L
Milk IgE: <0.1 kU/L
Peanut IgE: <0.1 kU/L
Scallop IgE: <0.1 kU/L
Sesame Seed f10: <0.1 kU/L
Shrimp IgE: <0.1 kU/L
Soybean IgE: <0.1 kU/L
Tuna IgE: <0.1 kU/L
Walnut: <0.1 kU/L
Wheat IgE: <0.1 kU/L

## 2020-04-25 LAB — INTERPRETATION:

## 2020-07-18 ENCOUNTER — Other Ambulatory Visit: Payer: Self-pay | Admitting: Primary Care

## 2020-07-18 DIAGNOSIS — E119 Type 2 diabetes mellitus without complications: Secondary | ICD-10-CM

## 2020-07-25 ENCOUNTER — Other Ambulatory Visit: Payer: Self-pay

## 2020-07-25 ENCOUNTER — Other Ambulatory Visit (INDEPENDENT_AMBULATORY_CARE_PROVIDER_SITE_OTHER): Payer: PPO

## 2020-07-25 DIAGNOSIS — E119 Type 2 diabetes mellitus without complications: Secondary | ICD-10-CM

## 2020-07-25 LAB — POCT GLYCOSYLATED HEMOGLOBIN (HGB A1C): Hemoglobin A1C: 6 % — AB (ref 4.0–5.6)

## 2020-08-16 DIAGNOSIS — H401231 Low-tension glaucoma, bilateral, mild stage: Secondary | ICD-10-CM | POA: Diagnosis not present

## 2020-08-29 ENCOUNTER — Other Ambulatory Visit: Payer: Self-pay | Admitting: Primary Care

## 2020-09-10 ENCOUNTER — Ambulatory Visit (INDEPENDENT_AMBULATORY_CARE_PROVIDER_SITE_OTHER): Payer: PPO | Admitting: Primary Care

## 2020-09-10 ENCOUNTER — Encounter: Payer: Self-pay | Admitting: Primary Care

## 2020-09-10 ENCOUNTER — Other Ambulatory Visit: Payer: Self-pay

## 2020-09-10 ENCOUNTER — Ambulatory Visit (INDEPENDENT_AMBULATORY_CARE_PROVIDER_SITE_OTHER)
Admission: RE | Admit: 2020-09-10 | Discharge: 2020-09-10 | Disposition: A | Discharge status: Home / Self Care | Payer: PPO | Source: Ambulatory Visit | Attending: Primary Care | Admitting: Primary Care

## 2020-09-10 VITALS — BP 110/62 | HR 72 | Temp 97.6°F | Ht 59.0 in | Wt 120.0 lb

## 2020-09-10 DIAGNOSIS — L820 Inflamed seborrheic keratosis: Secondary | ICD-10-CM | POA: Diagnosis not present

## 2020-09-10 DIAGNOSIS — E119 Type 2 diabetes mellitus without complications: Secondary | ICD-10-CM | POA: Diagnosis not present

## 2020-09-10 DIAGNOSIS — K219 Gastro-esophageal reflux disease without esophagitis: Secondary | ICD-10-CM | POA: Diagnosis not present

## 2020-09-10 DIAGNOSIS — G8911 Acute pain due to trauma: Secondary | ICD-10-CM

## 2020-09-10 DIAGNOSIS — M25512 Pain in left shoulder: Secondary | ICD-10-CM

## 2020-09-10 DIAGNOSIS — D225 Melanocytic nevi of trunk: Secondary | ICD-10-CM | POA: Diagnosis not present

## 2020-09-10 DIAGNOSIS — D485 Neoplasm of uncertain behavior of skin: Secondary | ICD-10-CM | POA: Diagnosis not present

## 2020-09-10 DIAGNOSIS — C44311 Basal cell carcinoma of skin of nose: Secondary | ICD-10-CM | POA: Diagnosis not present

## 2020-09-10 DIAGNOSIS — L821 Other seborrheic keratosis: Secondary | ICD-10-CM | POA: Diagnosis not present

## 2020-09-10 DIAGNOSIS — L814 Other melanin hyperpigmentation: Secondary | ICD-10-CM | POA: Diagnosis not present

## 2020-09-10 DIAGNOSIS — D1801 Hemangioma of skin and subcutaneous tissue: Secondary | ICD-10-CM | POA: Diagnosis not present

## 2020-09-10 MED ORDER — PREDNISONE 20 MG PO TABS: ORAL_TABLET | ORAL | 0 refills | Status: DC

## 2020-09-10 NOTE — Progress Notes (Signed)
Subjective:    Patient ID: Kayla Ritter, female    DOB: [birth year], 70 y.o.   MRN: [id number]  HPI  Kayla Ritter is a very pleasant 70 y.o. female with a history of type 2 diabetes, hyperlipidemia who presents today for follow up and a chief complaint of extremity pain.  1) Type 2 Diabetes:  Current medications include: None  She is checking her blood glucose _ times daily and is getting readings of  Last A1C: 6.6 in November 2021, 6.0 February 2022 Last Eye Exam: Follows every 6 months Last Foot Exam: Due Pneumonia Vaccination: 2020 ACE/ARB: None, urine microalbumin negative in November 2021 Statin: Lipitor  2) GERD: Currently managed on omeprazole 40 mg, overall doing well on this regimen. She did increase her dose to 40 mg BID last year which helped.   3) Extremity Pain: Acute to the left posterior and anterior shoulder since falling forward down some stairs at church one month ago, missed the last step. She hit the left side of her head, left shoulder.   Since then she's noticed aching to the left upper extremity, tingling. Pain is worse during the day, intermittent, doesn't wake her from sleep. She denies decrease in ROM. Symptoms will radiate from the shoulder down to the mid arm.   She's not seen anyone for these symptoms. She's taken Tylenol and Ibuprofen at times without much improvement. She denies dizziness, headaches, blurred vision.     Review of Systems  Musculoskeletal: Positive for arthralgias.  Neurological: Positive for numbness. Negative for dizziness, weakness and headaches.         Past Medical History:  Diagnosis Date  . Chickenpox   . Genital warts   . GERD (gastroesophageal reflux disease)   . Urinary tract infection     Social History   Socioeconomic History  . Marital status: Married    Spouse name: Not on file  . Number of children: Not on file  . Years of education: Not on file  . Highest education level: Not on file  Occupational  History  . Not on file  Tobacco Use  . Smoking status: Former Research scientist (life sciences)  . Smokeless tobacco: Never Used  Vaping Use  . Vaping Use: Never used  Substance and Sexual Activity  . Alcohol use: Yes    Comment: 1 glass of wine per month  . Drug use: Not Currently  . Sexual activity: Not on file  Other Topics Concern  . Not on file  Social History Narrative   Married.   4 children, 5 grandchildren.    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    Enjoys reading, crossword puzzles, spending time with grandchildren.    Social Determinants of Health   Financial Resource Strain: Low Risk   . Difficulty of Paying Living Expenses: Not hard at all  Food Insecurity: No Food Insecurity  . Worried About Charity fundraiser in the Last Year: Never true  . Ran Out of Food in the Last Year: Never true  Transportation Needs: No Transportation Needs  . Lack of Transportation (Medical): No  . Lack of Transportation (Non-Medical): No  Physical Activity: Sufficiently Active  . Days of Exercise per Week: 7 days  . Minutes of Exercise per Session: 30 min  Stress: No Stress Concern Present  . Feeling of Stress : Not at all  Social Connections: Not on file  Intimate Partner Violence: Not At Risk  . Fear of Current or Ex-Partner: No  .  Emotionally Abused: No  . Physically Abused: No  . Sexually Abused: No    Past Surgical History:  Procedure Laterality Date  . CATARACT EXTRACTION Right 08/29/2019  . CATARACT EXTRACTION Left 09/19/2019    Family History  Problem Relation Age of Onset  . Arthritis Mother   . Diabetes Mother   . Hearing loss Mother   . Hypertension Mother   . Kidney cancer Mother   . Hearing loss Father   . Asthma Brother   . Breast cancer Neg Hx     Allergies  Allergen Reactions  . Codeine     REACTION: N \\T \ V    Current Outpatient Medications on File Prior to Visit  Medication Sig Dispense Refill  . atorvastatin (LIPITOR) 40 MG tablet TAKE 1  TABLET (40 MG TOTAL) BY MOUTH EVERY EVENING. FOR CHOLESTEROL. 90 tablet 3  . CALCIUM PO Take 1,000 mg by mouth daily.    . Cholecalciferol (VITAMIN D PO) Take 1,000 Units by mouth daily.    Marland Kitchen omeprazole (PRILOSEC OTC) 20 MG tablet Take 20 mg by mouth daily.    Marland Kitchen alendronate (FOSAMAX) 70 MG tablet Take 1 tablet by mouth once weekly on an empty stomach with water for osteoporosis. Do not lay down within 1 hour after taking. (Patient not taking: Reported on 09/10/2020) 12 tablet 3   No current facility-administered medications on file prior to visit.    BP 110/62   Pulse 72   Temp 97.6 F (36.4 C) (Temporal)   Ht 4\' 11"  (1.499 m)   Wt 120 lb (54.4 kg)   SpO2 97%   BMI 24.24 kg/m  Objective:   Physical Exam Cardiovascular:     Rate and Rhythm: Normal rate and regular rhythm.  Pulmonary:     Effort: Pulmonary effort is normal.     Breath sounds: Normal breath sounds.  Musculoskeletal:     Right shoulder: Normal.     Left shoulder: Tenderness present. No bony tenderness. Normal range of motion.       Arms:     Cervical back: Neck supple.     Comments: Muscular tenderness to left posterior shoulder   Skin:    General: Skin is warm and dry.           Assessment & Plan:      This visit occurred during the SARS-CoV-2 public health emergency.  Safety protocols were in place, including screening questions prior to the visit, additional usage of staff PPE, and extensive cleaning of exam room while observing appropriate contact time as indicated for disinfecting solutions.

## 2020-09-10 NOTE — Assessment & Plan Note (Signed)
Significant improvement with A1C of 6.0 last month, commended her on lifestyle changes.  Continue off medications.  Follow up in 6 months

## 2020-09-10 NOTE — Assessment & Plan Note (Signed)
Doing well on omeprazole 40 mg daily, continue same.

## 2020-09-10 NOTE — Assessment & Plan Note (Signed)
Acute for one month, normal ROM today.  Checking plain films of the shoulder today. Rx for prednisone course sent to pharmacy.  Discussed sports medicine evaluation. Exam stable today.

## 2020-09-10 NOTE — Patient Instructions (Signed)
Complete xray(s) prior to leaving today. I will notify you of your results once received.  Start prednisone tablets. Take two tablets my mouth once daily for four days, then one tablet once daily for four days.   Set up a visit with Dr. Lorelei Pont for follow up on your shoulder.   It was a pleasure to see you today!

## 2020-09-26 ENCOUNTER — Emergency Department (HOSPITAL_COMMUNITY): Payer: PPO

## 2020-09-26 ENCOUNTER — Other Ambulatory Visit: Payer: Self-pay

## 2020-09-26 ENCOUNTER — Emergency Department (HOSPITAL_COMMUNITY)
Admission: EM | Admit: 2020-09-26 | Discharge: 2020-09-26 | Disposition: A | Discharge status: Home / Self Care | Payer: PPO | Attending: Emergency Medicine | Admitting: Emergency Medicine

## 2020-09-26 ENCOUNTER — Encounter (HOSPITAL_COMMUNITY): Payer: Self-pay | Admitting: Emergency Medicine

## 2020-09-26 DIAGNOSIS — R072 Precordial pain: Secondary | ICD-10-CM | POA: Insufficient documentation

## 2020-09-26 DIAGNOSIS — E119 Type 2 diabetes mellitus without complications: Secondary | ICD-10-CM | POA: Insufficient documentation

## 2020-09-26 DIAGNOSIS — R0789 Other chest pain: Secondary | ICD-10-CM | POA: Diagnosis not present

## 2020-09-26 DIAGNOSIS — R079 Chest pain, unspecified: Secondary | ICD-10-CM | POA: Diagnosis not present

## 2020-09-26 DIAGNOSIS — Z87891 Personal history of nicotine dependence: Secondary | ICD-10-CM | POA: Diagnosis not present

## 2020-09-26 DIAGNOSIS — J9811 Atelectasis: Secondary | ICD-10-CM | POA: Diagnosis not present

## 2020-09-26 LAB — CBC WITH DIFFERENTIAL/PLATELET
Abs Immature Granulocytes: 0.02 10*3/uL (ref 0.00–0.07)
Basophils Absolute: 0.1 10*3/uL (ref 0.0–0.1)
Basophils Relative: 1 %
Eosinophils Absolute: 0.2 10*3/uL (ref 0.0–0.5)
Eosinophils Relative: 3 %
HCT: 37.6 % (ref 36.0–46.0)
Hemoglobin: 12.4 g/dL (ref 12.0–15.0)
Immature Granulocytes: 0 %
Lymphocytes Relative: 33 %
Lymphs Abs: 1.9 10*3/uL (ref 0.7–4.0)
MCH: 29.5 pg (ref 26.0–34.0)
MCHC: 33 g/dL (ref 30.0–36.0)
MCV: 89.5 fL (ref 80.0–100.0)
Monocytes Absolute: 0.5 10*3/uL (ref 0.1–1.0)
Monocytes Relative: 10 %
Neutro Abs: 3 10*3/uL (ref 1.7–7.7)
Neutrophils Relative %: 53 %
Platelets: 252 10*3/uL (ref 150–400)
RBC: 4.2 MIL/uL (ref 3.87–5.11)
RDW: 13.2 % (ref 11.5–15.5)
WBC: 5.6 10*3/uL (ref 4.0–10.5)
nRBC: 0 % (ref 0.0–0.2)

## 2020-09-26 LAB — COMPREHENSIVE METABOLIC PANEL
ALT: 22 U/L (ref 0–44)
AST: 19 U/L (ref 15–41)
Albumin: 3.8 g/dL (ref 3.5–5.0)
Alkaline Phosphatase: 49 U/L (ref 38–126)
Anion gap: 10 (ref 5–15)
BUN: 10 mg/dL (ref 8–23)
CO2: 24 mmol/L (ref 22–32)
Calcium: 9.3 mg/dL (ref 8.9–10.3)
Chloride: 104 mmol/L (ref 98–111)
Creatinine, Ser: 0.85 mg/dL (ref 0.44–1.00)
GFR, Estimated: >60 mL/min (ref >60)
Glucose, Bld: 217 mg/dL — ABNORMAL HIGH (ref 70–99)
Potassium: 3.5 mmol/L (ref 3.5–5.1)
Sodium: 138 mmol/L (ref 135–145)
Total Bilirubin: 0.5 mg/dL (ref 0.3–1.2)
Total Protein: 6.8 g/dL (ref 6.5–8.1)

## 2020-09-26 LAB — TROPONIN I (HIGH SENSITIVITY): Troponin I (High Sensitivity): 4 ng/L (ref <18)

## 2020-09-26 NOTE — ED Triage Notes (Signed)
Emergency Medicine Provider Triage Evaluation Note  Kayla Ritter , a 70 y.o. female  was evaluated in triage.  Pt complains of chest pain, radiating to back and shoulder, nausea, pre-syncopal   Review of Systems  Positive: As above  Negative: Cough, fever, chills, syncope   Physical Exam  BP (!) 151/85 (BP Location: Right Arm)   Pulse 92   Temp 98 F (36.7 C) (Oral)   Resp 16   SpO2 98%  Gen:   Awake, no distress   HEENT:  Atraumatic  Resp:  Normal effort  Cardiac:  Normal rate  Abd:   Nondistended, nontender  MSK:   Moves extremities without difficulty  Neuro:  Speech clear   Medical Decision Making  Medically screening exam initiated at 4:08 PM.  Appropriate orders placed.  Aramis Zobel was informed that the remainder of the evaluation will be completed by another provider, this initial triage assessment does not replace that evaluation, and the importance of remaining in the ED until their evaluation is complete.  Clinical Impression   70 year old female who presents with intermittent chest pain for "quite some time", but worsening over the last 12 hours.  Overall well-appearing.  Plan for labs, troponin, EKG, chest x-ray.  Well-appearing, stable for further evaluation.   Garald Balding, PA-C 09/26/20 1608

## 2020-09-26 NOTE — ED Provider Notes (Signed)
[facility] EMERGENCY DEPARTMENT Provider Note   CSN: [id number] Arrival date & time: 09/26/20  1558     History Chief Complaint  Patient presents with  . Chest Pain    Kayla Ritter is a 70 y.o. female.  HPI Patient is a 70 year old female with past medical history of diabetes, hyperlipidemia is presenting with a chief complaint of chest pain.  Patient states that she fell at church down a couple stairs back in February and has had a waxing waning left-sided shoulder blade chest pain since then.  Today the pain change when around her precordial region.  Pain is more pronounced this morning but is now completely resolved.  She denies fevers or chills, nausea vomiting syncope or shortness of breath.  It is not worse with exertion she is not taking anything for the pain and yet has been completely resolved for approximately 6 hours.   Past Medical History:  Diagnosis Date  . Chickenpox   . Genital warts   . GERD (gastroesophageal reflux disease)   . Urinary tract infection     Patient Active Problem List   Diagnosis Date Noted  . Acute pain of left shoulder due to trauma 09/10/2020  . Toenail bruise 04/24/2020  . Preventative health care 03/16/2018  . Type 2 diabetes mellitus ([place]) 03/16/2018  . Osteopenia 01/12/2018  . VENEREAL WART 07/04/2007  . Hyperlipidemia 07/04/2007  . GERD 07/04/2007    Past Surgical History:  Procedure Laterality Date  . CATARACT EXTRACTION Right 08/29/2019  . CATARACT EXTRACTION Left 09/19/2019     OB History   No obstetric history on file.     Family History  Problem Relation Age of Onset  . Arthritis Mother   . Diabetes Mother   . Hearing loss Mother   . Hypertension Mother   . Kidney cancer Mother   . Hearing loss Father   . Asthma Brother   . Breast cancer Neg Hx     Social History   Tobacco Use  . Smoking status: Former Research scientist (life sciences)  . Smokeless tobacco: Never Used  Vaping Use  . Vaping Use: Never used   Substance Use Topics  . Alcohol use: Yes    Comment: 1 glass of wine per month  . Drug use: Not Currently    Home Medications Prior to Admission medications   Medication Sig Start Date End Date Taking? Authorizing Provider  alendronate (FOSAMAX) 70 MG tablet Take 1 tablet by mouth once weekly on an empty stomach with water for osteoporosis. Do not lay down within 1 hour after taking. Patient not taking: Reported on 09/10/2020 03/20/19   Pleas Koch, NP  atorvastatin (LIPITOR) 40 MG tablet TAKE 1 TABLET (40 MG TOTAL) BY MOUTH EVERY EVENING. FOR CHOLESTEROL. 03/15/20   Pleas Koch, NP  CALCIUM PO Take 1,000 mg by mouth daily.    [provider]  Cholecalciferol (VITAMIN D PO) Take 1,000 Units by mouth daily.    [provider]  omeprazole (PRILOSEC OTC) 20 MG tablet Take 20 mg by mouth daily.    [provider]  predniSONE (DELTASONE) 20 MG tablet Take two tablets my mouth once daily for four days, then one tablet once daily for four days. 09/10/20   Pleas Koch, NP    Allergies    Codeine  Review of Systems   Review of Systems  Constitutional: Negative for chills and fever.  HENT: Negative for ear pain and sore throat.   Eyes: Negative for  pain and visual disturbance.  Respiratory: Negative for cough and shortness of breath.   Cardiovascular: Positive for chest pain. Negative for palpitations.  Gastrointestinal: Negative for abdominal pain and vomiting.  Genitourinary: Negative for dysuria and hematuria.  Musculoskeletal: Negative for arthralgias and back pain.  Skin: Negative for color change and rash.  Neurological: Negative for seizures and syncope.  All other systems reviewed and are negative.   Physical Exam Updated Vital Signs BP 126/73   Pulse 61   Temp 98.1 F (36.7 C)   Resp 20   SpO2 100%   Physical Exam Vitals and nursing note reviewed.  Constitutional:      General: She is not in acute distress.    Appearance:  She is well-developed.  HENT:     Head: Normocephalic and atraumatic.  Eyes:     Conjunctiva/sclera: Conjunctivae normal.  Cardiovascular:     Rate and Rhythm: Normal rate and regular rhythm.     Heart sounds: No murmur heard.   Pulmonary:     Effort: Pulmonary effort is normal. No respiratory distress.     Breath sounds: Normal breath sounds.  Abdominal:     General: There is no distension.     Palpations: Abdomen is soft.     Tenderness: There is no abdominal tenderness. There is no right CVA tenderness or left CVA tenderness.  Musculoskeletal:        General: No swelling or tenderness. Normal range of motion.     Cervical back: Neck supple.  Skin:    General: Skin is warm and dry.  Neurological:     General: No focal deficit present.     Mental Status: She is alert and oriented to person, place, and time. Mental status is at baseline.     Cranial Nerves: No cranial nerve deficit.     ED Results / Procedures / Treatments   Labs (all labs ordered are listed, but only abnormal results are displayed) Labs Reviewed  COMPREHENSIVE METABOLIC PANEL - Abnormal; Notable for the following components:      Result Value   Glucose, Bld 217 (*)    All other components within normal limits  CBC WITH DIFFERENTIAL/PLATELET  TROPONIN I (HIGH SENSITIVITY)  TROPONIN I (HIGH SENSITIVITY)    EKG None  Radiology DG Chest 2 View  Result Date: 09/26/2020 CLINICAL DATA:  Chest pain and back pain. EXAM: CHEST - 2 VIEW COMPARISON:  04/11/2007 FINDINGS: The heart size and mediastinal contours are within normal limits. Both lungs are clear. The visualized skeletal structures are unremarkable. Mild linear atelectasis in the left lung base. IMPRESSION: No active cardiopulmonary disease. Electronically Signed   By: Franchot Gallo M.D.   On: 09/26/2020 17:23    Procedures Procedures   Medications Ordered in ED Medications - No data to display  ED Course  I have reviewed the triage vital  signs and the nursing notes.  Pertinent labs & imaging results that were available during my care of the patient were reviewed by me and considered in my medical decision making (see chart for details).    MDM Rules/Calculators/A&P                          Medical Decision Making:  Kayla Ritter is a 70 y.o. female with a minimal medical history, who presented to the ED today with chest pain.  Patient's pain started 12 hours ago and resolved 6 hours ago.  Based on patient's comorbidities, patient  has a heart score of 3.  On my initial exam, the pt was in no acute distress.  Vital signs notable for no acute abnormalities. Reviewed and confirmed nursing documentation for past medical history, family history, social history.   Initial Assessment:  With the patient's presentation of left-sided chest pain, most likely diagnose is musculoskeletal chest pain versus ACS. Other diagnoses were considered including (but not limited to) pulmonary embolism, community-acquired pneumonia, aortic dissection, pneumothorax, underlying bony abnormality, anemia. These are considered less likely due to history of present illness and physical exam findings.  In particular, concerning pulmonary embolism: Patient is PERC positive and the they deny malignancy, hormone usage, history of DVT,  or calf tenderness leading to a low risk Wells score.  Initial Plan: 1. Evaluate for ACS with single troponin and EKG evaluated as below 2. Evaluate for dissection, bony abnormality, or pneumonia with chest x-ray and screening laboratory evaluation including CBC, BMP 3. Further evaluation for pulmonary embolism not indicated at this time based on patient's PERC and Wells score.    Initial Study Results:  EKG was reviewed independently and under the supervision of the attending provider. Rate, rhythm, axis, intervals all examined and without medically relevant abnormality. ST segments without concerns for elevations. Examination for  cardiogenic sources of syncope completed without delta waves indicative of WPW, without Brugada wave forms, without evidence of HOCM including lack of sharp Q waves or obvious hypertrophy, without evidence of prolonged QT syndrome, and no epsilon waves indicative of arhythmogenic right ventricular dysplasia  Laboratory . Single troponin demonstrated . CBC and BMP without obvious metabolic or inflammatory abnormalities requiring further evaluation  Radiology DG Chest 2 View  Final Result      Final Assessment and Plan:  Patient's chest pain is more consistent musculoskeletal etiology given the recent fall and the negative troponin at this time.  No acute indication of ACS.  Patient stable for continued outpatient follow-up, already has appointment scheduled with primary care provider for further evaluation of the scapular pain. Disposition: Based on the above findings, I believe patient is stable for discharge.   Patient/family educated about specific return precautions for given chief complaint and symptoms.  Patient/family educated about follow-up with PCP.  Patient/family expressed understanding of return precautions and need for follow-up. Patient spoken to regarding all imaging and laboratory results and appropriate follow up for these results. All education provided in verbal form with additional information in written form. Time was allowed for answering of patient questions. Patient discharged.   Emergency Department Medication Summary: Medications - No data to display   Final Clinical Impression(s) / ED Diagnoses Final diagnoses:  Chest pain    Rx / DC Orders ED Discharge Orders    None       Tretha Sciara, MD 09/26/20 2133    Margette Fast, MD 09/29/20 [phone]

## 2020-09-26 NOTE — Discharge Instructions (Addendum)
You were seen today for chest pain.  However given the improvement of your symptoms and your reassuring EKG and laboratory results, there is no evidence of heart attack at this time.  However, it is important a follow-up with your primary care provider given the transient nature of your symptoms.  Please return with any changes in your symptoms including fevers or chills, nausea or vomiting, shortness of breath or changes in your chest pain.  Thank you for the opportunity to take part of your care.

## 2020-09-26 NOTE — ED Triage Notes (Signed)
Pt c/o chest pain that has been going on for "a while" and worsening in the last day. Denies shortness of breath but states that sometimes she feels like she needs to take a deeper breath.

## 2020-09-30 ENCOUNTER — Encounter (HOSPITAL_COMMUNITY): Payer: Self-pay | Admitting: Emergency Medicine

## 2020-10-08 ENCOUNTER — Ambulatory Visit (INDEPENDENT_AMBULATORY_CARE_PROVIDER_SITE_OTHER): Payer: PPO | Admitting: Primary Care

## 2020-10-08 ENCOUNTER — Other Ambulatory Visit: Payer: Self-pay

## 2020-10-08 ENCOUNTER — Encounter: Payer: Self-pay | Admitting: Primary Care

## 2020-10-08 VITALS — BP 124/60 | HR 67 | Temp 98.6°F | Ht 59.0 in | Wt 118.0 lb

## 2020-10-08 DIAGNOSIS — M25512 Pain in left shoulder: Secondary | ICD-10-CM

## 2020-10-08 DIAGNOSIS — G8911 Acute pain due to trauma: Secondary | ICD-10-CM | POA: Diagnosis not present

## 2020-10-08 DIAGNOSIS — M542 Cervicalgia: Secondary | ICD-10-CM

## 2020-10-08 NOTE — Assessment & Plan Note (Signed)
Continued, now with left lateral neck pain and left upper extremity paresthesias.   Exam today overall negative.  Given continued pain/paresthesias and now involvement of left neck and no improvement with prednisone, will obtain MRI.  Discussed that PT may be needed prior to MRI, but will defer that decision to insurance company.   Orders placed.

## 2020-10-08 NOTE — Progress Notes (Signed)
Subjective:    Patient ID: Kayla Ritter, female    DOB: 08/12/1950, 70 y.o.   MRN: [id number]  HPI  Kayla Ritter is a very pleasant 70 y.o. female who presents today   She was last evaluated on 09/10/20 for diabetes follow up and a chief complaint of extremity pain. Pain was located to the left posterior and anterior shoulder x 1 month since falling forward down stairs at church. Xrays of the left shoulder were negative during that visit.  Since her last visit she's been evaluated at [facility] for chest pain and shoulder pain. Chest xray negative, labs and ECG negative. Symptoms were suspected to be secondary to MSK involvement. She was discharged home later that day.  Today she endorses continued pain to her left anterior and posterior shoulder, waxing and waning symptoms. She is requesting an MRI of her left shoulder and neck. She does have tingling to the fingers of her left upper extremity. She also endorses a bluish color to her toes on the left foot, some tingling. Improved with elevation of her leg.   She denies decrease in ROM, right sided pain, re-injury. She felt no improvement with prednisone from last visit. Aleve does help some. Several of her church members have recommended a few physical therapists.   Review of Systems  Musculoskeletal: Positive for arthralgias, neck pain and neck stiffness.  Skin: Positive for color change.  Neurological: Positive for numbness.         Past Medical History:  Diagnosis Date  . Chickenpox   . Genital warts   . GERD (gastroesophageal reflux disease)   . Urinary tract infection     Social History   Socioeconomic History  . Marital status: Married    Spouse name: Not on file  . Number of children: Not on file  . Years of education: Not on file  . Highest education level: Not on file  Occupational History  . Not on file  Tobacco Use  . Smoking status: Former Research scientist (life sciences)  . Smokeless tobacco: Never Used  Vaping Use  . Vaping Use:  Never used  Substance and Sexual Activity  . Alcohol use: Yes    Comment: 1 glass of wine per month  . Drug use: Not Currently  . Sexual activity: Not on file  Other Topics Concern  . Not on file  Social History Narrative   ** Merged History Encounter **       Married. 4 children, 5 grandchildren.  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  Enjoys reading, crossword puzzles, spending time with grandchildren.    Social Determinants of Health   Financial Resource Strain: Low Risk   . Difficulty of Paying Living Expenses: Not hard at all  Food Insecurity: No Food Insecurity  . Worried About Charity fundraiser in the Last Year: Never true  . Ran Out of Food in the Last Year: Never true  Transportation Needs: No Transportation Needs  . Lack of Transportation (Medical): No  . Lack of Transportation (Non-Medical): No  Physical Activity: Sufficiently Active  . Days of Exercise per Week: 7 days  . Minutes of Exercise per Session: 30 min  Stress: No Stress Concern Present  . Feeling of Stress : Not at all  Social Connections: Not on file  Intimate Partner Violence: Not At Risk  . Fear of Current or Ex-Partner: No  . Emotionally Abused: No  . Physically Abused: No  . Sexually Abused: No  Past Surgical History:  Procedure Laterality Date  . CATARACT EXTRACTION Right 08/29/2019  . CATARACT EXTRACTION Left 09/19/2019    Family History  Problem Relation Age of Onset  . Arthritis Mother   . Diabetes Mother   . Hearing loss Mother   . Hypertension Mother   . Kidney cancer Mother   . Hearing loss Father   . Asthma Brother   . Breast cancer Neg Hx     Allergies  Allergen Reactions  . Codeine     REACTION: N \\T \ V    Current Outpatient Medications on File Prior to Visit  Medication Sig Dispense Refill  . alendronate (FOSAMAX) 70 MG tablet Take 1 tablet by mouth once weekly on an empty stomach with water for osteoporosis. Do not lay down within 1  hour after taking. (Patient not taking: Reported on 09/10/2020) 12 tablet 3  . atorvastatin (LIPITOR) 40 MG tablet TAKE 1 TABLET (40 MG TOTAL) BY MOUTH EVERY EVENING. FOR CHOLESTEROL. 90 tablet 3  . CALCIUM PO Take 1,000 mg by mouth daily.    . Cholecalciferol (VITAMIN D PO) Take 1,000 Units by mouth daily.    Marland Kitchen omeprazole (PRILOSEC OTC) 20 MG tablet Take 20 mg by mouth daily.    . predniSONE (DELTASONE) 20 MG tablet Take two tablets my mouth once daily for four days, then one tablet once daily for four days. 12 tablet 0   No current facility-administered medications on file prior to visit.    There were no vitals taken for this visit. Objective:   Physical Exam Neck:     Comments: Stiffness (left lateral) reported to patient with active ROM exercises.  Cardiovascular:     Rate and Rhythm: Normal rate and regular rhythm.  Pulmonary:     Effort: Pulmonary effort is normal.     Breath sounds: Normal breath sounds.  Musculoskeletal:     Left shoulder: No tenderness or bony tenderness. Normal range of motion. Normal strength.     Cervical back: No spinous process tenderness or muscular tenderness. Normal range of motion.     Comments: 5/5 strength to bilateral upper extremities.   Skin:    General: Skin is warm and dry.  Neurological:     Mental Status: She is alert.           Assessment & Plan:      This visit occurred during the SARS-CoV-2 public health emergency.  Safety protocols were in place, including screening questions prior to the visit, additional usage of staff PPE, and extensive cleaning of exam room while observing appropriate contact time as indicated for disinfecting solutions.

## 2020-10-08 NOTE — Patient Instructions (Signed)
You will be contacted regarding your MRI.  Please let us know if you have not been contacted within two weeks.   It was a pleasure to see you today!

## 2020-10-08 NOTE — Assessment & Plan Note (Signed)
Since trauma 2 months ago. No bony tenderness or abnormality on exam.  MRI order of cervical spine and shoulder placed.

## 2020-10-21 ENCOUNTER — Other Ambulatory Visit: Payer: Self-pay

## 2020-10-21 ENCOUNTER — Ambulatory Visit
Admission: RE | Admit: 2020-10-21 | Discharge: 2020-10-21 | Disposition: A | Discharge status: Home / Self Care | Payer: PPO | Source: Ambulatory Visit | Attending: Primary Care | Admitting: Primary Care

## 2020-10-21 DIAGNOSIS — R202 Paresthesia of skin: Secondary | ICD-10-CM | POA: Diagnosis not present

## 2020-10-21 DIAGNOSIS — G8911 Acute pain due to trauma: Secondary | ICD-10-CM | POA: Insufficient documentation

## 2020-10-21 DIAGNOSIS — M542 Cervicalgia: Secondary | ICD-10-CM | POA: Diagnosis not present

## 2020-10-21 DIAGNOSIS — M50321 Other cervical disc degeneration at C4-C5 level: Secondary | ICD-10-CM | POA: Diagnosis not present

## 2020-10-21 DIAGNOSIS — M25512 Pain in left shoulder: Secondary | ICD-10-CM | POA: Diagnosis not present

## 2020-10-21 DIAGNOSIS — M4802 Spinal stenosis, cervical region: Secondary | ICD-10-CM | POA: Diagnosis not present

## 2020-10-21 DIAGNOSIS — M19012 Primary osteoarthritis, left shoulder: Secondary | ICD-10-CM | POA: Diagnosis not present

## 2020-10-24 DIAGNOSIS — M542 Cervicalgia: Secondary | ICD-10-CM

## 2020-10-24 DIAGNOSIS — G8911 Acute pain due to trauma: Secondary | ICD-10-CM

## 2020-10-24 NOTE — Addendum Note (Signed)
Addended by: Pleas Koch on: 10/24/2020 06:15 PM   Modules accepted: Orders

## 2020-10-24 NOTE — Telephone Encounter (Signed)
Pt states she would like to go to [facility] physical therapy

## 2020-10-24 NOTE — Telephone Encounter (Signed)
Called l/m to call back need to see what location if kate sends orders she would like to go to

## 2020-11-20 DIAGNOSIS — H401231 Low-tension glaucoma, bilateral, mild stage: Secondary | ICD-10-CM | POA: Diagnosis not present

## 2021-01-16 DIAGNOSIS — C44319 Basal cell carcinoma of skin of other parts of face: Secondary | ICD-10-CM | POA: Diagnosis not present

## 2021-02-03 ENCOUNTER — Encounter: Payer: Self-pay | Admitting: Emergency Medicine

## 2021-02-03 ENCOUNTER — Ambulatory Visit
Admission: EM | Admit: 2021-02-03 | Discharge: 2021-02-03 | Disposition: A | Discharge status: Home / Self Care | Payer: PPO | Attending: Emergency Medicine | Admitting: Emergency Medicine

## 2021-02-03 ENCOUNTER — Other Ambulatory Visit: Payer: Self-pay

## 2021-02-03 DIAGNOSIS — R300 Dysuria: Secondary | ICD-10-CM | POA: Diagnosis not present

## 2021-02-03 DIAGNOSIS — R030 Elevated blood-pressure reading, without diagnosis of hypertension: Secondary | ICD-10-CM

## 2021-02-03 DIAGNOSIS — N898 Other specified noninflammatory disorders of vagina: Secondary | ICD-10-CM

## 2021-02-03 HISTORY — DX: Hyperlipidemia, unspecified: E78.5

## 2021-02-03 LAB — POCT URINALYSIS DIP (MANUAL ENTRY)
Bilirubin, UA: NEGATIVE
Glucose, UA: NEGATIVE mg/dL
Ketones, POC UA: NEGATIVE mg/dL
Nitrite, UA: NEGATIVE
Protein Ur, POC: NEGATIVE mg/dL
Spec Grav, UA: 1.015 (ref 1.010–1.025)
Urobilinogen, UA: 0.2 E.U./dL
pH, UA: 8 (ref 5.0–8.0)

## 2021-02-03 MED ORDER — CEPHALEXIN 500 MG PO CAPS: 500.0000 mg | ORAL_CAPSULE | Freq: Two times a day (BID) | ORAL | 0 refills | Status: AC

## 2021-02-03 MED ORDER — FLUCONAZOLE 150 MG PO TABS: 150.0000 mg | ORAL_TABLET | Freq: Every day | ORAL | 0 refills | Status: DC

## 2021-02-03 NOTE — ED Provider Notes (Signed)
Roderic Palau    CSN: [id number] Arrival date & time: 02/03/21  1029      History   Chief Complaint Chief Complaint  Patient presents with   Vaginal Discharge   Dysuria    HPI Kayla Ritter is a 70 y.o. female.  Patient presents with dysuria and thick white vaginal discharge x2 to 3 days.  No treatments attempted at home.  She denies fever, chills, abdominal pain, flank pain, pelvic pain, rash, lesions, or other symptoms.  Her medical history includes diabetes but she no longer has to take medication for this.  The history is provided by the patient.   Past Medical History:  Diagnosis Date   Chickenpox    Genital warts    GERD (gastroesophageal reflux disease)    Hyperlipidemia    Urinary tract infection     Patient Active Problem List   Diagnosis Date Noted   Acute neck pain 10/08/2020   Acute pain of left shoulder due to trauma 09/10/2020   Toenail bruise 04/24/2020   Preventative health care 03/16/2018   Type 2 diabetes mellitus ([place]) 03/16/2018   Osteopenia 01/12/2018   VENEREAL WART 07/04/2007   Hyperlipidemia 07/04/2007   GERD 07/04/2007    Past Surgical History:  Procedure Laterality Date   CATARACT EXTRACTION Right 08/29/2019   CATARACT EXTRACTION Left 09/19/2019    OB History   No obstetric history on file.      Home Medications    Prior to Admission medications   Medication Sig Start Date End Date Taking? Authorizing Provider  atorvastatin (LIPITOR) 40 MG tablet TAKE 1 TABLET (40 MG TOTAL) BY MOUTH EVERY EVENING. FOR CHOLESTEROL. 03/15/20  Yes Pleas Koch, NP  CALCIUM PO Take 1,000 mg by mouth daily.   Yes [provider]  cephALEXin (KEFLEX) 500 MG capsule Take 1 capsule (500 mg total) by mouth 2 (two) times daily for 5 days. 02/03/21 02/08/21 Yes Sharion Balloon, NP  Cholecalciferol (VITAMIN D PO) Take 1,000 Units by mouth daily.   Yes [provider]  fluconazole (DIFLUCAN) 150 MG tablet Take 1 tablet (150 mg  total) by mouth daily. Take one tablet today.  May repeat in 3 days. 02/03/21  Yes Sharion Balloon, NP  omeprazole (PRILOSEC OTC) 20 MG tablet Take 20 mg by mouth daily.   Yes [provider]  alendronate (FOSAMAX) 70 MG tablet Take 1 tablet by mouth once weekly on an empty stomach with water for osteoporosis. Do not lay down within 1 hour after taking. 03/20/19   Pleas Koch, NP  predniSONE (DELTASONE) 20 MG tablet Take two tablets my mouth once daily for four days, then one tablet once daily for four days. 09/10/20   Pleas Koch, NP    Family History Family History  Problem Relation Age of Onset   Arthritis Mother    Diabetes Mother    Hearing loss Mother    Hypertension Mother    Kidney cancer Mother    Hearing loss Father    Asthma Brother    Breast cancer Neg Hx     Social History Social History   Tobacco Use   Smoking status: Former   Smokeless tobacco: Never  Scientific laboratory technician Use: Never used  Substance Use Topics   Alcohol use: Yes    Comment: 1 glass of wine per month   Drug use: Not Currently     Allergies   Codeine   Review of Systems Review  of Systems  Constitutional:  Negative for chills and fever.  Respiratory:  Negative for cough and shortness of breath.   Cardiovascular:  Negative for chest pain and palpitations.  Gastrointestinal:  Negative for abdominal pain, constipation, diarrhea and vomiting.  Genitourinary:  Positive for dysuria and vaginal discharge. Negative for flank pain, hematuria and pelvic pain.  Skin:  Negative for color change and rash.  All other systems reviewed and are negative.   Physical Exam Triage Vital Signs ED Triage Vitals  Enc Vitals Group     BP      Pulse      Resp      Temp      Temp src      SpO2      Weight      Height      Head Circumference      Peak Flow      Pain Score      Pain Loc      Pain Edu?      Excl. in [place]?    No data found.  Updated Vital Signs BP (!) 166/87 (BP  Location: Left Arm)   Pulse 65   Temp 98.2 F (36.8 C) (Oral)   Resp 18   SpO2 98%   Visual Acuity Right Eye Distance:   Left Eye Distance:   Bilateral Distance:    Right Eye Near:   Left Eye Near:    Bilateral Near:     Physical Exam Vitals and nursing note reviewed.  Constitutional:      General: She is not in acute distress.    Appearance: She is well-developed. She is not ill-appearing.  HENT:     Head: Normocephalic and atraumatic.     Mouth/Throat:     Mouth: Mucous membranes are moist.  Eyes:     Conjunctiva/sclera: Conjunctivae normal.  Cardiovascular:     Rate and Rhythm: Normal rate and regular rhythm.     Heart sounds: Normal heart sounds.  Pulmonary:     Effort: Pulmonary effort is normal. No respiratory distress.     Breath sounds: Normal breath sounds.  Abdominal:     General: Bowel sounds are normal. There is no distension.     Palpations: Abdomen is soft.     Tenderness: There is no abdominal tenderness. There is no right CVA tenderness, left CVA tenderness, guarding or rebound.  Musculoskeletal:     Cervical back: Neck supple.  Skin:    General: Skin is warm and dry.  Neurological:     General: No focal deficit present.     Mental Status: She is alert and oriented to person, place, and time.     Gait: Gait normal.  Psychiatric:        Mood and Affect: Mood normal.        Behavior: Behavior normal.     UC Treatments / Results  Labs (all labs ordered are listed, but only abnormal results are displayed) Labs Reviewed  POCT URINALYSIS DIP (MANUAL ENTRY) - Abnormal; Notable for the following components:      Result Value   Blood, UA trace-intact (*)    Leukocytes, UA Trace (*)    All other components within normal limits  URINE CULTURE  CERVICOVAGINAL ANCILLARY ONLY    EKG   Radiology No results found.  Procedures Procedures (including critical care time)  Medications Ordered in UC Medications - No data to display  Initial  Impression / Assessment and Plan / UC Course  I have reviewed the triage vital signs and the nursing notes.  Pertinent labs & imaging results that were available during my care of the patient were reviewed by me and considered in my medical decision making (see chart for details).  Dysuria, vaginal discharge, elevated blood pressure reading.  Treating with Keflex. Urine culture pending. Discussed with patient that we will call her if the urine culture shows the need to change or discontinue the antibiotic.  Patient obtained vaginal self swab for testing.  Treating with Diflucan.  Instructed her to follow-up with her PCP or OB/GYN if her symptoms are not improving.   Patient agrees to plan of care.      Final Clinical Impressions(s) / UC Diagnoses   Final diagnoses:  Dysuria  Vaginal discharge  Elevated blood pressure reading     Discharge Instructions      Take the antibiotic as directed.  The urine culture is pending.  We will call you if it shows the need to change or discontinue your antibiotic.    Your vaginal tests are pending.  If your test results are positive, we will call you.     Your blood pressure is elevated today at 166/87.  Please have this rechecked by your primary care provider in 2-4 weeks.          ED Prescriptions     Medication Sig Dispense Auth. Provider   cephALEXin (KEFLEX) 500 MG capsule Take 1 capsule (500 mg total) by mouth 2 (two) times daily for 5 days. 10 capsule Sharion Balloon, NP   fluconazole (DIFLUCAN) 150 MG tablet Take 1 tablet (150 mg total) by mouth daily. Take one tablet today.  May repeat in 3 days. 2 tablet Sharion Balloon, NP      PDMP not reviewed this encounter.   Sharion Balloon, NP 02/03/21 [phone]

## 2021-02-03 NOTE — Discharge Instructions (Addendum)
Take the antibiotic as directed.  The urine culture is pending.  We will call you if it shows the need to change or discontinue your antibiotic.    Your vaginal tests are pending.  If your test results are positive, we will call you.     Your blood pressure is elevated today at 166/87.  Please have this rechecked by your primary care provider in 2-4 weeks.

## 2021-02-03 NOTE — ED Triage Notes (Signed)
Patient c/o vaginal discharge and dysuria x 2 days.   Patient endorses " creamy thick" discharge. Patient endorses nausea at times.   Patient denies vaginal odor or itching. Patient denies dysuria, fever, ABD pain, emesis or diarrhea.   Patient endorses " a weird feeling after peeing".   Patient hasn't taken any medications for symptoms.

## 2021-02-04 ENCOUNTER — Ambulatory Visit: Payer: PPO | Admitting: Family Medicine

## 2021-02-04 LAB — CERVICOVAGINAL ANCILLARY ONLY
Bacterial Vaginitis (gardnerella): NEGATIVE
Candida Glabrata: NEGATIVE
Candida Vaginitis: NEGATIVE
Chlamydia: NEGATIVE
Comment: NEGATIVE
Comment: NEGATIVE
Comment: NEGATIVE
Comment: NEGATIVE
Comment: NEGATIVE
Comment: NORMAL
Neisseria Gonorrhea: NEGATIVE
Trichomonas: NEGATIVE

## 2021-02-04 LAB — URINE CULTURE: Culture: <10000 — AB

## 2021-02-20 DIAGNOSIS — Z48817 Encounter for surgical aftercare following surgery on the skin and subcutaneous tissue: Secondary | ICD-10-CM | POA: Diagnosis not present

## 2021-04-20 NOTE — Progress Notes (Signed)
Subjective:   Kayla Ritter is a 70 y.o. female who presents for Medicare Annual (Subsequent) preventive examination.  I connected with Kayla Ritter today by telephone and verified that I am speaking with the correct person using two identifiers. Location patient: home Location provider: work Persons participating in the virtual visit: patient, Marine scientist.    I discussed the limitations, risks, security and privacy concerns of performing an evaluation and management service by telephone and the availability of in person appointments. I also discussed with the patient that there may be a patient responsible charge related to this service. The patient expressed understanding and verbally consented to this telephonic visit.    Interactive audio and video telecommunications were attempted between this provider and patient, however failed, due to patient having technical difficulties OR patient did not have access to video capability.  We continued and completed visit with audio only.  Some vital signs may be absent or patient reported.   Time Spent with patient on telephone encounter: 25 minutes   Review of Systems     Cardiac Risk Factors include: advanced age (>77men, >24 women);diabetes mellitus;dyslipidemia     Objective:    Today's Vitals   04/22/21 1311  Weight: 118 lb (53.5 kg)  Height: 4\' 11"  (1.499 m)   Body mass index is 23.83 kg/m.  Advanced Directives 04/22/2021 04/19/2020 03/17/2019 03/14/2018  Does Patient Have a Medical Advance Directive? Yes Yes No Yes  Type of Paramedic of [place];Living will [place];Living will - Living will  Does patient want to make changes to medical advance directive? Yes (MAU/Ambulatory/Procedural Areas - Information given) - - -  Copy of [place] in Chart? Yes - validated most recent copy scanned in chart (See row information) No - copy requested - -  Would patient like information  on creating a medical advance directive? - - No - Patient declined -    Current Medications (verified) Outpatient Encounter Medications as of 04/22/2021  Medication Sig   alendronate (FOSAMAX) 70 MG tablet Take 1 tablet by mouth once weekly on an empty stomach with water for osteoporosis. Do not lay down within 1 hour after taking.   atorvastatin (LIPITOR) 40 MG tablet TAKE 1 TABLET (40 MG TOTAL) BY MOUTH EVERY EVENING. FOR CHOLESTEROL.   CALCIUM PO Take 1,000 mg by mouth daily.   Cholecalciferol (VITAMIN D PO) Take 1,000 Units by mouth daily.   omeprazole (PRILOSEC OTC) 20 MG tablet Take 20 mg by mouth daily.   fluconazole (DIFLUCAN) 150 MG tablet Take 1 tablet (150 mg total) by mouth daily. Take one tablet today.  May repeat in 3 days. (Patient not taking: Reported on 04/22/2021)   predniSONE (DELTASONE) 20 MG tablet Take two tablets my mouth once daily for four days, then one tablet once daily for four days. (Patient not taking: Reported on 04/22/2021)   No facility-administered encounter medications on file as of 04/22/2021.    Allergies (verified) Codeine   History: Past Medical History:  Diagnosis Date   Chickenpox    Genital warts    GERD (gastroesophageal reflux disease)    Hyperlipidemia    Urinary tract infection    Past Surgical History:  Procedure Laterality Date   CATARACT EXTRACTION Right 08/29/2019   CATARACT EXTRACTION Left 09/19/2019   Family History  Problem Relation Age of Onset   Arthritis Mother    Diabetes Mother    Hearing loss Mother    Hypertension Mother  Kidney cancer Mother    Hearing loss Father    Asthma Brother    Breast cancer Neg Hx    Social History   Socioeconomic History   Marital status: Married    Spouse name: Not on file   Number of children: Not on file   Years of education: Not on file   Highest education level: Not on file  Occupational History   Not on file  Tobacco Use   Smoking status: Former   Smokeless tobacco:  Never  Vaping Use   Vaping Use: Never used  Substance and Sexual Activity   Alcohol use: Yes    Comment: 1 glass of wine per month   Drug use: Not Currently   Sexual activity: Not on file  Other Topics Concern   Not on file  Social History Narrative   ** Merged History Encounter **       Married. 4 children, 5 grandchildren.  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  Enjoys reading, crossword puzzles, spending time with grandchildren.    Social Determinants of Health   Financial Resource Strain: Low Risk    Difficulty of Paying Living Expenses: Not hard at all  Food Insecurity: No Food Insecurity   Worried About Charity fundraiser in the Last Year: Never true   [place] in the Last Year: Never true  Transportation Needs: No Transportation Needs   Lack of Transportation (Medical): No   Lack of Transportation (Non-Medical): No  Physical Activity: Sufficiently Active   Days of Exercise per Week: 7 days   Minutes of Exercise per Session: 30 min  Stress: No Stress Concern Present   Feeling of Stress : Not at all  Social Connections: Socially Integrated   Frequency of Communication with Friends and Family: More than three times a week   Frequency of Social Gatherings with Friends and Family: Twice a week   Attends Religious Services: More than 4 times per year   Active Member of [employer] or Organizations: Yes   Attends Music therapist: More than 4 times per year   Marital Status: Married    Tobacco Counseling Counseling given: Not Answered   Clinical Intake:  Pre-visit preparation completed: Yes  Pain : No/denies pain     BMI - recorded: 23.82 Nutritional Status: BMI of 19-24  Normal Nutritional Risks: None Diabetes: Yes CBG done?: No (visit completed over the phone) Did pt. bring in CBG monitor from home?: No  How often do you need to have someone help you when you read instructions, pamphlets, or other written  materials from your doctor or pharmacy?: 1 - Never Diabetes:  Is the patient diabetic?  Yes  If diabetic, was a CBG obtained today?  No , visit completed over the phone. Did the patient bring in their glucometer from home?  No , visit completed over the phone. How often do you monitor your CBG's? Patient states not currently checking blood sugars.   Financial Strains and Diabetes Management:  Are you having any financial strains with the device, your supplies or your medication? No .  Does the patient want to be seen by Chronic Care Management for management of their diabetes?  No  Would the patient like to be referred to a Nutritionist or for Diabetic Management?  No   Diabetic Exams:  Diabetic Eye Exam: Completed 10/2020.  Diabetic Foot Exam: Due,  Pt is scheduled for diabetic foot exam on 05/13/21.  Interpreter Needed?: No  Information entered by :: Orrin Brigham LPN   Activities of Daily Living In your present state of health, do you have any difficulty performing the following activities: 04/22/2021  Hearing? N  Vision? N  Difficulty concentrating or making decisions? N  Walking or climbing stairs? N  Dressing or bathing? N  Doing errands, shopping? N  Preparing Food and eating ? N  Using the Toilet? N  In the past six months, have you accidently leaked urine? N  Do you have problems with loss of bowel control? N  Managing your Finances? N  Housekeeping or managing your Housekeeping? N  Some recent data might be hidden    Patient Care Team: Pleas Koch, NP as PCP - General (Internal Medicine)  Indicate any recent Medical Services you may have received from other than [facility] providers in the past year (date may be approximate).     Assessment:   This is a routine wellness examination for Noely.  Hearing/Vision screen Hearing Screening - Comments:: No issues with hearing Vision Screening - Comments:: Last eye exam 10/2020, Dr Prudencio Burly, had cataract  surgery  Dietary issues and exercise activities discussed: Current Exercise Habits: Home exercise routine, Type of exercise: walking, Time (Minutes): 30, Frequency (Times/Week): 7, Weekly Exercise (Minutes/Week): 210, Intensity: Moderate, Exercise limited by: None identified   Goals Addressed             This Visit's Progress    Patient Stated       Maintain current lifestyle       Depression Screen PHQ 2/9 Scores 04/22/2021 04/19/2020 03/17/2019 03/14/2018  PHQ - 2 Score 0 0 0 0  PHQ- 9 Score - 0 0 0    Fall Risk Fall Risk  04/22/2021 04/24/2020 04/19/2020 03/17/2019 03/14/2018  Falls in the past year? 0 0 0 0 No  Number falls in past yr: 0 0 0 - -  Injury with Fall? 0 0 0 - -  Risk for fall due to : No Fall Risks - No Fall Risks - -  Follow up Falls prevention discussed - Falls evaluation completed;Falls prevention discussed Falls evaluation completed;Falls prevention discussed -    FALL RISK PREVENTION PERTAINING TO THE HOME:  Any stairs in or around the home? Yes  If so, are there any without handrails? Yes  Home free of loose throw rugs in walkways, pet beds, electrical cords, etc? Yes  Adequate lighting in your home to reduce risk of falls? Yes   ASSISTIVE DEVICES UTILIZED TO PREVENT FALLS:  Life alert? No  Use of a cane, walker or w/c? No  Grab bars in the bathroom? Yes  Shower chair or bench in shower? Yes  Elevated toilet seat or a handicapped toilet? No   TIMED UP AND GO:  Was the test performed? No , visit completed over the phone.   Cognitive Function: Normal cognitive status assessed by this Nurse Health Advisor. No abnormalities found.   MMSE - Mini Mental State Exam 04/19/2020 03/17/2019 03/14/2018  Orientation to time 1 5 5   Orientation to Place 5 5 5   Registration 3 3 3   Attention/ Calculation 5 5 0  Recall 3 3 3   Language- name 2 objects - - 0  Language- repeat 1 1 1   Language- follow 3 step command - - 3  Language- read & follow direction - - 0   Write a sentence - - 0  Copy design - - 0  Total score - - 20  Immunizations Immunization History  Administered Date(s) Administered   Fluad Quad(high Dose 65+) 05/18/2019, 04/24/2020   Influenza,inj,Quad PF,6+ Mos 03/16/2018   PFIZER(Purple Top)SARS-COV-2 Vaccination 07/28/2019, 08/22/2019   Pneumococcal Conjugate-13 03/20/2019   Pneumococcal Polysaccharide-23 03/16/2018   Td 06/16/2011   Zoster Recombinat (Shingrix) 08/13/2018    TDAP status: Up to date  Flu Vaccine status: Due, Education has been provided regarding the importance of this vaccine. Advised may receive this vaccine at local pharmacy or Health Dept. Aware to provide a copy of the vaccination record if obtained from local pharmacy or Health Dept. Verbalized acceptance and understanding.  Pneumococcal vaccine status: Up to date  Covid-19 vaccine status: Declined, Education has been provided regarding the importance of this vaccine but patient still declined. Advised may receive this vaccine at local pharmacy or Health Dept.or vaccine clinic. Aware to provide a copy of the vaccination record if obtained from local pharmacy or Health Dept. Verbalized acceptance and understanding.  Qualifies for Shingles Vaccine? Yes   Zostavax completed No   Shingrix Completed?: Yes  Screening Tests Health Maintenance  Topic Date Due   Zoster Vaccines- Shingrix (2 of 2) 10/08/2018   COVID-19 Vaccine (3 - Booster for Pfizer series) 10/17/2019   FOOT EXAM  06/28/2020   OPHTHALMOLOGY EXAM  09/13/2020   INFLUENZA VACCINE  01/13/2021   HEMOGLOBIN A1C  01/22/2021   URINE MICROALBUMIN  04/19/2021   TETANUS/TDAP  06/15/2021   MAMMOGRAM  06/25/2021   COLONOSCOPY (Pts 45-85yrs Insurance coverage will need to be confirmed)  08/13/2024   Pneumonia Vaccine 68+ Years old  Completed   DEXA SCAN  Completed   Hepatitis C Screening  Completed   HPV VACCINES  Aged Out    Health Maintenance  Health Maintenance Due  Topic Date Due    Zoster Vaccines- Shingrix (2 of 2) 10/08/2018   COVID-19 Vaccine (3 - Booster for Pfizer series) 10/17/2019   FOOT EXAM  06/28/2020   OPHTHALMOLOGY EXAM  09/13/2020   INFLUENZA VACCINE  01/13/2021   HEMOGLOBIN A1C  01/22/2021   URINE MICROALBUMIN  04/19/2021    Colorectal cancer screening: Type of screening: Colonoscopy. Completed 08/14/14. Repeat every 10 years  Mammogram status: Due, last exam 06/26/19, patient plans on discussing with PCP.  Bone Density status: Completed 06/26/19. Results reflect: Bone density results: OSTEOPOROSIS. Repeat every 2 years.  Lung Cancer Screening: (Low Dose CT Chest recommended if Age 53-80 years, 30 pack-year currently smoking OR have quit w/in 15years.) does not qualify.     Additional Screening:  Hepatitis C Screening: does qualify; Completed 03/14/18  Vision Screening: Recommended annual ophthalmology exams for early detection of glaucoma and other disorders of the eye. Is the patient up to date with their annual eye exam?  Yes  Who is the provider or what is the name of the office in which the patient attends annual eye exams? Dr. Prudencio Burly If pt is not established with a provider, would they like to be referred to a provider to establish care? No .   Dental Screening: Recommended annual dental exams for proper oral hygiene  Community Resource Referral / Chronic Care Management: CRR required this visit?  No   CCM required this visit?  No      Plan:     I have personally reviewed and noted the following in the patient's chart:   Medical and social history Use of alcohol, tobacco or illicit drugs  Current medications and supplements including opioid prescriptions.  Functional ability and status Nutritional status Physical  activity Advanced directives List of other physicians Hospitalizations, surgeries, and ER visits in previous 12 months Vitals Screenings to include cognitive, depression, and falls Referrals and appointments  In  addition, I have reviewed and discussed with patient certain preventive protocols, quality metrics, and best practice recommendations. A written personalized care plan for preventive services as well as general preventive health recommendations were provided to patient.   Due to this being a telephonic visit, the after visit summary with patients personalized plan was offered to patient via mail or my-chart. Patient would like to access on my-chart.  Loma Messing, LPN   [id number]   Nurse Health Advisor  Nurse Notes: None

## 2021-04-22 ENCOUNTER — Ambulatory Visit (INDEPENDENT_AMBULATORY_CARE_PROVIDER_SITE_OTHER): Payer: PPO

## 2021-04-22 VITALS — Ht 59.0 in | Wt 118.0 lb

## 2021-04-22 DIAGNOSIS — Z0000 Encounter for general adult medical examination without abnormal findings: Secondary | ICD-10-CM

## 2021-04-22 NOTE — Patient Instructions (Signed)
Kayla Ritter , Thank you for taking time to complete your Medicare Wellness Visit. I appreciate your ongoing commitment to your health goals. Please review the following plan we discussed and let me know if I can assist you in the future.   Screening recommendations/referrals: Colonoscopy: up to date, last completed 08/14/14, due 08/13/24 Mammogram: due, last exam 06/26/19, please discuss with PCP of you decide to schedule. Bone Density: up to date, completed 06/26/19, due 06/25/21 Recommended yearly ophthalmology/optometry visit for glaucoma screening and checkup Recommended yearly dental visit for hygiene and checkup  Vaccinations: Influenza vaccine: Due-May obtain vaccine at our office or your local pharmacy.  Pneumococcal vaccine: up to date Tdap vaccine: up to date, completed 06/16/11, due 06/15/21 Shingles vaccine: Discuss with pharmacy for second shot   Covid-19: newest booster available at your local pharmacy  Advanced directives: Copy on file   Conditions/risks identified: see problem list  Next appointment: Follow up in one year for your annual wellness visit    Preventive Care 70 Years and Older, Female Preventive care refers to lifestyle choices and visits with your health care provider that can promote health and wellness. What does preventive care include? A yearly physical exam. This is also called an annual well check. Dental exams once or twice a year. Routine eye exams. Ask your health care provider how often you should have your eyes checked. Personal lifestyle choices, including: Daily care of your teeth and gums. Regular physical activity. Eating a healthy diet. Avoiding tobacco and drug use. Limiting alcohol use. Practicing safe sex. Taking low-dose aspirin every day. Taking vitamin and mineral supplements as recommended by your health care provider. What happens during an annual well check? The services and screenings done by your health care provider during your  annual well check will depend on your age, overall health, lifestyle risk factors, and family history of disease. Counseling  Your health care provider may ask you questions about your: Alcohol use. Tobacco use. Drug use. Emotional well-being. Home and relationship well-being. Sexual activity. Eating habits. History of falls. Memory and ability to understand (cognition). Work and work Statistician. Reproductive health. Screening  You may have the following tests or measurements: Height, weight, and BMI. Blood pressure. Lipid and cholesterol levels. These may be checked every 5 years, or more frequently if you are over 70 years old. Skin check. Lung cancer screening. You may have this screening every year starting at age 70 if you have a 30-pack-year history of smoking and currently smoke or have quit within the past 15 years. Fecal occult blood test (FOBT) of the stool. You may have this test every year starting at age 70. Flexible sigmoidoscopy or colonoscopy. You may have a sigmoidoscopy every 5 years or a colonoscopy every 10 years starting at age 70. Hepatitis C blood test. Hepatitis B blood test. Sexually transmitted disease (STD) testing. Diabetes screening. This is done by checking your blood sugar (glucose) after you have not eaten for a while (fasting). You may have this done every 1-3 years. Bone density scan. This is done to screen for osteoporosis. You may have this done starting at age 70. Mammogram. This may be done every 1-2 years. Talk to your health care provider about how often you should have regular mammograms. Talk with your health care provider about your test results, treatment options, and if necessary, the need for more tests. Vaccines  Your health care provider may recommend certain vaccines, such as: Influenza vaccine. This is recommended every year. Tetanus, diphtheria,  and acellular pertussis (Tdap, Td) vaccine. You may need a Td booster every 10  years. Zoster vaccine. You may need this after age 46. Pneumococcal 13-valent conjugate (PCV13) vaccine. One dose is recommended after age 70. Pneumococcal polysaccharide (PPSV23) vaccine. One dose is recommended after age 70. Talk to your health care provider about which screenings and vaccines you need and how often you need them. This information is not intended to replace advice given to you by your health care provider. Make sure you discuss any questions you have with your health care provider. Document Released: 06/28/2015 Document Revised: 02/19/2016 Document Reviewed: 04/02/2015 Elsevier Interactive Patient Education  2017 [place] Prevention in the Home Falls can cause injuries. They can happen to people of all ages. There are many things you can do to make your home safe and to help prevent falls. What can I do on the outside of my home? Regularly fix the edges of walkways and driveways and fix any cracks. Remove anything that might make you trip as you walk through a door, such as a raised step or threshold. Trim any bushes or trees on the path to your home. Use bright outdoor lighting. Clear any walking paths of anything that might make someone trip, such as rocks or tools. Regularly check to see if handrails are loose or broken. Make sure that both sides of any steps have handrails. Any raised decks and porches should have guardrails on the edges. Have any leaves, snow, or ice cleared regularly. Use sand or salt on walking paths during winter. Clean up any spills in your garage right away. This includes oil or grease spills. What can I do in the bathroom? Use night lights. Install grab bars by the toilet and in the tub and shower. Do not use towel bars as grab bars. Use non-skid mats or decals in the tub or shower. If you need to sit down in the shower, use a plastic, non-slip stool. Keep the floor dry. Clean up any water that spills on the floor as soon as it  happens. Remove soap buildup in the tub or shower regularly. Attach bath mats securely with double-sided non-slip rug tape. Do not have throw rugs and other things on the floor that can make you trip. What can I do in the bedroom? Use night lights. Make sure that you have a light by your bed that is easy to reach. Do not use any sheets or blankets that are too big for your bed. They should not hang down onto the floor. Have a firm chair that has side arms. You can use this for support while you get dressed. Do not have throw rugs and other things on the floor that can make you trip. What can I do in the kitchen? Clean up any spills right away. Avoid walking on wet floors. Keep items that you use a lot in easy-to-reach places. If you need to reach something above you, use a strong step stool that has a grab bar. Keep electrical cords out of the way. Do not use floor polish or wax that makes floors slippery. If you must use wax, use non-skid floor wax. Do not have throw rugs and other things on the floor that can make you trip. What can I do with my stairs? Do not leave any items on the stairs. Make sure that there are handrails on both sides of the stairs and use them. Fix handrails that are broken or loose. Make sure  that handrails are as long as the stairways. Check any carpeting to make sure that it is firmly attached to the stairs. Fix any carpet that is loose or worn. Avoid having throw rugs at the top or bottom of the stairs. If you do have throw rugs, attach them to the floor with carpet tape. Make sure that you have a light switch at the top of the stairs and the bottom of the stairs. If you do not have them, ask someone to add them for you. What else can I do to help prevent falls? Wear shoes that: Do not have high heels. Have rubber bottoms. Are comfortable and fit you well. Are closed at the toe. Do not wear sandals. If you use a stepladder: Make sure that it is fully opened.  Do not climb a closed stepladder. Make sure that both sides of the stepladder are locked into place. Ask someone to hold it for you, if possible. Clearly mark and make sure that you can see: Any grab bars or handrails. First and last steps. Where the edge of each step is. Use tools that help you move around (mobility aids) if they are needed. These include: Canes. Walkers. Scooters. Crutches. Turn on the lights when you go into a dark area. Replace any light bulbs as soon as they burn out. Set up your furniture so you have a clear path. Avoid moving your furniture around. If any of your floors are uneven, fix them. If there are any pets around you, be aware of where they are. Review your medicines with your doctor. Some medicines can make you feel dizzy. This can increase your chance of falling. Ask your doctor what other things that you can do to help prevent falls. This information is not intended to replace advice given to you by your health care provider. Make sure you discuss any questions you have with your health care provider. Document Released: 03/28/2009 Document Revised: 11/07/2015 Document Reviewed: 07/06/2014 Elsevier Interactive Patient Education  2017 [employer].

## 2021-04-28 ENCOUNTER — Encounter: Payer: PPO | Admitting: Primary Care

## 2021-05-02 ENCOUNTER — Other Ambulatory Visit: Payer: Self-pay | Admitting: Primary Care

## 2021-05-02 DIAGNOSIS — E785 Hyperlipidemia, unspecified: Secondary | ICD-10-CM

## 2021-05-06 ENCOUNTER — Encounter: Payer: PPO | Admitting: Primary Care

## 2021-05-13 ENCOUNTER — Other Ambulatory Visit: Payer: Self-pay

## 2021-05-13 ENCOUNTER — Ambulatory Visit (INDEPENDENT_AMBULATORY_CARE_PROVIDER_SITE_OTHER): Payer: PPO | Admitting: Primary Care

## 2021-05-13 ENCOUNTER — Encounter: Payer: Self-pay | Admitting: Primary Care

## 2021-05-13 VITALS — BP 138/76 | HR 86 | Temp 97.8°F | Ht 59.0 in | Wt 118.0 lb

## 2021-05-13 DIAGNOSIS — K649 Unspecified hemorrhoids: Secondary | ICD-10-CM | POA: Insufficient documentation

## 2021-05-13 DIAGNOSIS — E119 Type 2 diabetes mellitus without complications: Secondary | ICD-10-CM | POA: Diagnosis not present

## 2021-05-13 DIAGNOSIS — Z0001 Encounter for general adult medical examination with abnormal findings: Secondary | ICD-10-CM

## 2021-05-13 DIAGNOSIS — K219 Gastro-esophageal reflux disease without esophagitis: Secondary | ICD-10-CM

## 2021-05-13 DIAGNOSIS — E2839 Other primary ovarian failure: Secondary | ICD-10-CM | POA: Diagnosis not present

## 2021-05-13 DIAGNOSIS — E785 Hyperlipidemia, unspecified: Secondary | ICD-10-CM | POA: Diagnosis not present

## 2021-05-13 DIAGNOSIS — M8580 Other specified disorders of bone density and structure, unspecified site: Secondary | ICD-10-CM | POA: Diagnosis not present

## 2021-05-13 DIAGNOSIS — Z1231 Encounter for screening mammogram for malignant neoplasm of breast: Secondary | ICD-10-CM

## 2021-05-13 DIAGNOSIS — Z23 Encounter for immunization: Secondary | ICD-10-CM

## 2021-05-13 DIAGNOSIS — M546 Pain in thoracic spine: Secondary | ICD-10-CM | POA: Diagnosis not present

## 2021-05-13 LAB — LIPID PANEL
Cholesterol: 137 mg/dL (ref 0–200)
HDL: 53 mg/dL (ref >39.00)
LDL Cholesterol: 55 mg/dL (ref 0–99)
NonHDL: 84.11
Total CHOL/HDL Ratio: 3
Triglycerides: 144 mg/dL (ref 0.0–149.0)
VLDL: 28.8 mg/dL (ref 0.0–40.0)

## 2021-05-13 LAB — COMPREHENSIVE METABOLIC PANEL
ALT: 14 U/L (ref 0–35)
AST: 15 U/L (ref 0–37)
Albumin: 4.1 g/dL (ref 3.5–5.2)
Alkaline Phosphatase: 50 U/L (ref 39–117)
BUN: 8 mg/dL (ref 6–23)
CO2: 31 mEq/L (ref 19–32)
Calcium: 9.4 mg/dL (ref 8.4–10.5)
Chloride: 103 mEq/L (ref 96–112)
Creatinine, Ser: 0.8 mg/dL (ref 0.40–1.20)
GFR: 74.62 mL/min (ref >60.00)
Glucose, Bld: 79 mg/dL (ref 70–99)
Potassium: 3.5 mEq/L (ref 3.5–5.1)
Sodium: 140 mEq/L (ref 135–145)
Total Bilirubin: 0.5 mg/dL (ref 0.2–1.2)
Total Protein: 7 g/dL (ref 6.0–8.3)

## 2021-05-13 LAB — MICROALBUMIN / CREATININE URINE RATIO
Creatinine,U: 52.7 mg/dL
Microalb Creat Ratio: 1.3 mg/g (ref 0.0–30.0)
Microalb, Ur: <0.7 mg/dL (ref 0.0–1.9)

## 2021-05-13 LAB — HEMOGLOBIN A1C: Hgb A1c MFr Bld: 6.3 % (ref 4.6–6.5)

## 2021-05-13 MED ORDER — TIZANIDINE HCL 4 MG PO TABS
4.0000 mg | ORAL_TABLET | Freq: Every evening | ORAL | 0 refills | Status: DC | PRN
Start: 2021-05-13 — End: 2021-08-18

## 2021-05-13 NOTE — Assessment & Plan Note (Signed)
To right upper, posterior shoulder region. Exam today overall stable.  Trial of muscle relaxer provided per patient request, Tizanidine 4 mg provided. Drowsiness precautions provided.   Discussed stretching and ROM exercises.   She will update.

## 2021-05-13 NOTE — Assessment & Plan Note (Signed)
Doing well on omeprazole 20 mg daily, continue same.

## 2021-05-13 NOTE — Assessment & Plan Note (Signed)
Not on medications, continue same for now.   Foot exam today. Eye exam UTD. Managed on statin.  Urine microalbumin due and pending.  Follow up in 6-12 months based on results. A1C pending.

## 2021-05-13 NOTE — Assessment & Plan Note (Signed)
Chronic.  Offered GI referral for which she kindly declines.  Will try to obtain colonoscopy results from [facility].  She will update.

## 2021-05-13 NOTE — Patient Instructions (Signed)
Stop by the lab prior to leaving today. I will notify you of your results once received.   [facility] to [facility] your mammogram and bone density scan.  Try the tizanidine muscle relaxer to take as needed for spasms. This may cause drowsiness.   It was a pleasure to see you today!    Preventive Care 65 Years and Older, Female Preventive care refers to lifestyle choices and visits with your health care provider that can promote health and wellness. Preventive care visits are also called wellness exams. What can I expect for my preventive care visit? Counseling Your health care provider may ask you questions about your: Medical history, including: Past medical problems. Family medical history. Pregnancy and menstrual history. History of falls. Current health, including: Memory and ability to understand (cognition). Emotional well-being. Home life and relationship well-being. Sexual activity and sexual health. Lifestyle, including: Alcohol, nicotine or tobacco, and drug use. Access to firearms. Diet, exercise, and sleep habits. Work and work environment. Sunscreen use. Safety issues such as seatbelt and bike helmet use. Physical exam Your health care provider will check your: Height and weight. These may be used to calculate your BMI (body mass index). BMI is a measurement that tells if you are at a healthy weight. Waist circumference. This measures the distance around your waistline. This measurement also tells if you are at a healthy weight and may help predict your risk of certain diseases, such as type 2 diabetes and high blood pressure. Heart rate and blood pressure. Body temperature. Skin for abnormal spots. What immunizations do I need? Vaccines are usually given at various ages, according to a schedule. Your health care provider will recommend vaccines for you based on your age, medical history, and lifestyle or other factors, such as travel or where you  work. What tests do I need? Screening Your health care provider may recommend screening tests for certain conditions. This may include: Lipid and cholesterol levels. Hepatitis C test. Hepatitis B test. HIV (human immunodeficiency virus) test. STI (sexually transmitted infection) testing, if you are at risk. Lung cancer screening. Colorectal cancer screening. Diabetes screening. This is done by checking your blood sugar (glucose) after you have not eaten for a while (fasting). Mammogram. Talk with your health care provider about how often you should have regular mammograms. BRCA-related cancer screening. This may be done if you have a family history of breast, ovarian, tubal, or peritoneal cancers. Bone density scan. This is done to screen for osteoporosis. Talk with your health care provider about your test results, treatment options, and if necessary, the need for more tests. Follow these instructions at home: Eating and drinking  Eat a diet that includes fresh fruits and vegetables, whole grains, lean protein, and low-fat dairy products. Limit your intake of foods with high amounts of sugar, saturated fats, and salt. Take vitamin and mineral supplements as recommended by your health care provider. Do not drink alcohol if your health care provider tells you not to drink. If you drink alcohol: Limit how much you have to 0-1 drink a day. Know how much alcohol is in your drink. In the [place] one drink equals one 12 oz bottle of beer (355 mL), one 5 oz glass of wine (148 mL), or one 1 oz glass of hard liquor (44 mL). Lifestyle Brush your teeth every morning and night with fluoride toothpaste. Floss one time each day. Exercise for at least 30 minutes 5 or more days each week. Do not use any   products that contain nicotine or tobacco. These products include cigarettes, chewing tobacco, and vaping devices, such as e-cigarettes. If you need help quitting, ask your health care provider. Do not  use drugs. If you are sexually active, practice safe sex. Use a condom or other form of protection in order to prevent STIs. Take aspirin only as told by your health care provider. Make sure that you understand how much to take and what form to take. Work with your health care provider to find out whether it is safe and beneficial for you to take aspirin daily. Ask your health care provider if you need to take a cholesterol-lowering medicine (statin). Find healthy ways to manage stress, such as: Meditation, yoga, or listening to music. Journaling. Talking to a trusted person. Spending time with friends and family. Minimize exposure to UV radiation to reduce your risk of skin cancer. Safety Always wear your seat belt while driving or riding in a vehicle. Do not drive: If you have been drinking alcohol. Do not ride with someone who has been drinking. When you are tired or distracted. While texting. If you have been using any mind-altering substances or drugs. Wear a helmet and other protective equipment during sports activities. If you have firearms in your house, make sure you follow all gun safety procedures. What's next? Visit your health care provider once a year for an annual wellness visit. Ask your health care provider how often you should have your eyes and teeth checked. Stay up to date on all vaccines. This information is not intended to replace advice given to you by your health care provider. Make sure you discuss any questions you have with your health care provider. Document Revised: 11/27/2020 Document Reviewed: 11/27/2020 Elsevier Patient Education  [place].

## 2021-05-13 NOTE — Progress Notes (Signed)
Subjective:    Patient ID: Kayla Ritter, female    DOB: 19-Jul-1950, 70 y.o.   MRN: [id number]  HPI  Kayla Ritter is a very pleasant 70 y.o. female who presents today for complete physical and follow up of chronic conditions.  She would also like to mention chronic and intermittent hemorrhoids with bright red rectal bleeding that occurs with bowel movements. Recent episode began a few months ago. She's been using a rectal suppository OTC she's noticed improvement.   She would also like to discuss acute right posterior shoulder pain for which is constant and "tight", worse with ROM of right upper extremity. Symptoms began four days ago. She denies injury/trauma , numbness/tingling, radiculopathy to upper extremities, neck pain.   Immunizations: -Tetanus: 2013 -Influenza: Due -Covid-19: 2 vaccines -Shingles: Completed in 2020 -Pneumonia: Prevnar 13 in 2020, pneumovax in 2019   Diet: [place].  Exercise: Walking 4 days weekly.  Eye exam: Completes semi-annually  Dental exam: Completes semi-annually   Mammogram: Completed in January 2021 Dexa: Completed in January 2021 Colonoscopy: Completed within 10 years per patient, we do not have records.   BP Readings from Last 3 Encounters:  05/13/21 138/76  02/03/21 (!) 166/87  10/08/20 124/60       Review of Systems  Constitutional:  Negative for unexpected weight change.  HENT:  Negative for rhinorrhea.   Respiratory:  Negative for cough and shortness of breath.   Cardiovascular:  Negative for chest pain.  Gastrointestinal:  Positive for blood in stool. Negative for constipation and diarrhea.       Bright red rectal bleeding with bowel movements. See HPI.  Genitourinary:  Negative for difficulty urinating.  Musculoskeletal:  Positive for myalgias. Negative for arthralgias.       Acute right shoulder pain, see HPI  Skin:  Negative for rash.  Allergic/Immunologic: Negative for environmental allergies.  Neurological:   Negative for dizziness, numbness and headaches.  Psychiatric/Behavioral:  The patient is not nervous/anxious.         Past Medical History:  Diagnosis Date   Chickenpox    Genital warts    GERD (gastroesophageal reflux disease)    Hyperlipidemia    Urinary tract infection     Social History   Socioeconomic History   Marital status: Married    Spouse name: Not on file   Number of children: Not on file   Years of education: Not on file   Highest education level: Not on file  Occupational History   Not on file  Tobacco Use   Smoking status: Former   Smokeless tobacco: Never  Vaping Use   Vaping Use: Never used  Substance and Sexual Activity   Alcohol use: Yes    Comment: 1 glass of wine per month   Drug use: Not Currently   Sexual activity: Not on file  Other Topics Concern   Not on file  Social History Narrative   ** Merged History Encounter **       Married. 4 children, 5 grandchildren.  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  Enjoys reading, crossword puzzles, spending time with grandchildren.    Social Determinants of Health   Financial Resource Strain: Low Risk    Difficulty of Paying Living Expenses: Not hard at all  Food Insecurity: No Food Insecurity   Worried About Charity fundraiser in the Last Year: Never true   Ran Out of Food in the Last Year: Never true  Transportation  Needs: No Transportation Needs   Lack of Transportation (Medical): No   Lack of Transportation (Non-Medical): No  Physical Activity: Sufficiently Active   Days of Exercise per Week: 7 days   Minutes of Exercise per Session: 30 min  Stress: No Stress Concern Present   Feeling of Stress : Not at all  Social Connections: Socially Integrated   Frequency of Communication with Friends and Family: More than three times a week   Frequency of Social Gatherings with Friends and Family: Twice a week   Attends Religious Services: More than 4 times per year    Active Member of [employer] or Organizations: Yes   Attends Music therapist: More than 4 times per year   Marital Status: Married  Human resources officer Violence: Not At Risk   Fear of Current or Ex-Partner: No   Emotionally Abused: No   Physically Abused: No   Sexually Abused: No    Past Surgical History:  Procedure Laterality Date   CATARACT EXTRACTION Right 08/29/2019   CATARACT EXTRACTION Left 09/19/2019    Family History  Problem Relation Age of Onset   Arthritis Mother    Diabetes Mother    Hearing loss Mother    Hypertension Mother    Kidney cancer Mother    Hearing loss Father    Stomach cancer Father    Asthma Brother    Breast cancer Neg Hx     Allergies  Allergen Reactions   Codeine     REACTION: N \\T \ V    Current Outpatient Medications on File Prior to Visit  Medication Sig Dispense Refill   atorvastatin (LIPITOR) 40 MG tablet Take 1 tablet (40 mg total) by mouth daily. For cholesterol. Office visit required for further refills. 30 tablet 0   CALCIUM PO Take 1,000 mg by mouth daily.     Cholecalciferol (VITAMIN D PO) Take 1,000 Units by mouth daily.     omeprazole (PRILOSEC OTC) 20 MG tablet Take 20 mg by mouth daily.     alendronate (FOSAMAX) 70 MG tablet Take 1 tablet by mouth once weekly on an empty stomach with water for osteoporosis. Do not lay down within 1 hour after taking. (Patient not taking: Reported on 05/13/2021) 12 tablet 3   No current facility-administered medications on file prior to visit.    BP 138/76   Pulse 86   Temp 97.8 F (36.6 C) (Temporal)   Ht 4\' 11"  (1.499 m)   Wt 118 lb (53.5 kg)   SpO2 98%   BMI 23.83 kg/m  Objective:   Physical Exam HENT:     Right Ear: Tympanic membrane and ear canal normal.     Left Ear: Tympanic membrane and ear canal normal.     Nose: Nose normal.  Eyes:     Conjunctiva/sclera: Conjunctivae normal.     Pupils: Pupils are equal, round, and reactive to light.  Neck:     Thyroid: No  thyromegaly.  Cardiovascular:     Rate and Rhythm: Normal rate and regular rhythm.     Heart sounds: No murmur heard. Pulmonary:     Effort: Pulmonary effort is normal.     Breath sounds: Normal breath sounds. No rales.  Abdominal:     General: Bowel sounds are normal.     Palpations: Abdomen is soft.     Tenderness: There is no abdominal tenderness.  Musculoskeletal:        General: Normal range of motion.     Right shoulder: Normal.  Left shoulder: Normal.     Cervical back: Neck supple.     Thoracic back: Spasms and tenderness present. Normal range of motion.       Back:  Lymphadenopathy:     Cervical: No cervical adenopathy.  Skin:    General: Skin is warm and dry.     Findings: No rash.  Neurological:     Mental Status: She is alert and oriented to person, place, and time.     Cranial Nerves: No cranial nerve deficit.     Deep Tendon Reflexes: Reflexes are normal and symmetric.          Assessment & Plan:      This visit occurred during the SARS-CoV-2 public health emergency.  Safety protocols were in place, including screening questions prior to the visit, additional usage of staff PPE, and extensive cleaning of exam room while observing appropriate contact time as indicated for disinfecting solutions.

## 2021-05-13 NOTE — Assessment & Plan Note (Signed)
No recent use of Fosamax 70 mg weekly, last dose was nearly 2 years ago.  Bone density scan due in January 2023, await results.   Compliant to calcium and vitamin D, continue same. Continue daily walking.

## 2021-05-13 NOTE — Assessment & Plan Note (Signed)
Influenza vaccine provided today. Other vaccines UTD. Colonoscopy date unknown, will try to obtain records. Mammogram and bone density scan due in January 2023, orders placed.  Discussed the importance of a healthy diet and regular exercise in order for weight loss, and to reduce the risk of further co-morbidity.  Exam today as noted. Labs pending.

## 2021-05-15 DIAGNOSIS — M546 Pain in thoracic spine: Secondary | ICD-10-CM

## 2021-05-15 MED ORDER — PREDNISONE 20 MG PO TABS: ORAL_TABLET | ORAL | 0 refills | Status: DC

## 2021-05-26 DIAGNOSIS — H401231 Low-tension glaucoma, bilateral, mild stage: Secondary | ICD-10-CM | POA: Diagnosis not present

## 2021-05-30 ENCOUNTER — Other Ambulatory Visit: Payer: Self-pay | Admitting: Primary Care

## 2021-05-30 DIAGNOSIS — E785 Hyperlipidemia, unspecified: Secondary | ICD-10-CM

## 2021-05-30 DIAGNOSIS — M546 Pain in thoracic spine: Secondary | ICD-10-CM

## 2021-06-02 ENCOUNTER — Ambulatory Visit (INDEPENDENT_AMBULATORY_CARE_PROVIDER_SITE_OTHER): Payer: PPO | Admitting: Family Medicine

## 2021-06-02 ENCOUNTER — Other Ambulatory Visit: Payer: Self-pay

## 2021-06-02 ENCOUNTER — Encounter: Payer: Self-pay | Admitting: Family Medicine

## 2021-06-02 VITALS — BP 130/72 | HR 85 | Temp 97.9°F | Ht 59.0 in | Wt 118.5 lb

## 2021-06-02 DIAGNOSIS — M5412 Radiculopathy, cervical region: Secondary | ICD-10-CM

## 2021-06-02 MED ORDER — PREDNISONE 20 MG PO TABS: ORAL_TABLET | ORAL | 0 refills | Status: DC

## 2021-06-02 NOTE — Progress Notes (Signed)
Kayla Ritter T. Orman Matsumura, MD, [place] at [facility] [place], [address]  Phone: [phone]   FAX: [place] - 70 y.o. female   MRN [id number]   Date of Birth: April 20, 1951  Date: 06/02/2021   PCP: Pleas Koch, NP   Referral: Pleas Koch, NP  Chief Complaint  Patient presents with   Shoulder Pain    Right    This visit occurred during the SARS-CoV-2 public health emergency.  Safety protocols were in place, including screening questions prior to the visit, additional usage of staff PPE, and extensive cleaning of exam room while observing appropriate contact time as indicated for disinfecting solutions.   Subjective:   Kayla Ritter is a 70 y.o. very pleasant female patient with Body mass index is 23.93 kg/m. who presents with the following:  She is having pain now and the right shoulder region, and she is having pain in the shoulder blade much of the time, and she is also having pain going down distally into the right arm.  She is not having any significant tingling, and no loss of sensation or function.  She did get a course of some oral steroids, and this did help.  I reviewed a prior MRI of the C-spine, and she does have extensive spinal stenosis as well as foraminal stenosis throughout the cervical spine.  Prior to this, however, she was effectively asymptomatic despite some multiple chronic pathologies on her MRI.  She does not really have much significant pain with abduction or internal rotation.  MRI of shoulder and c-spine reviewed.  No tingling. No major issues R shoulder in the past.     Review of Systems is noted in the HPI, as appropriate  Objective:   BP 130/72    Pulse 85    Temp 97.9 F (36.6 C) (Temporal)    Ht 4\' 11"  (1.499 m)    Wt 118 lb 8 oz (53.8 kg)    SpO2 99%    BMI 23.93 kg/m   GEN: No acute distress; alert,appropriate. PULM: Breathing comfortably in no  respiratory distress PSYCH: Normally interactive.    CERVICAL SPINE EXAM Range of motion: Flexion, extension, lateral bending, and rotation: Global 15% loss of motion in all directions Pain with terminal motion: Mild Spinous Processes: NT SCM: NT Upper paracervical muscles: Minimal Upper traps: NT C5-T1 intact, sensation and motor  She is having pain in the shoulder blade region. I am unable to provoke any tenderness with any of the parascapular muscular trigger including all trapezius, rhomboids.  Additionally all posterior rotator cuff muscles are nontender. -She is able to induce pain with a self Spurling's  Shoulder: Right Inspection: No muscle wasting or winging Ecchymosis/edema: neg  AC joint, scapula, clavicle: NT Abduction: full, 5/5 Flexion: full, 5/5 IR, full, lift-off: 5/5 ER at neutral: full, 5/5 AC crossover and compression: neg Neer: neg Hawkins: neg Drop Test: neg Empty Can: neg Supraspinatus insertion: NT Bicipital groove: NT Speed's: neg Yergason's: neg Sulcus sign: neg Scapular dyskinesis: She does have forward sloping scapulas C5-T1 intact Sensation intact Grip 5/5   Laboratory and Imaging Data:  Assessment and Plan:     ICD-10-CM   1. Cervical radiculopathy, acute  M54.12      Right-sided cervical radiculopathy with referred pain to the shoulder blade. Her entire exam of the right shoulder is completely normal, and is remarkably good for age 58.  She has extensive foraminal  stenosis bilaterally throughout multiple levels of the C-spine as well as quite significant spinal stenosis.  This correlates clinically.  I am going to have the patient do a longer course of steroids, and then she is going to come back to be reassessed.  Certainly physical therapy with traction would be a good option.  We could also try some neuropathic pain medication.  She does have extensive pathology, so if symptoms do persist intervention could be considered.  This is  multilevel and extensive, so I initially probably seeing physical medicine and rehab or other interventional spine practitioner if needed would probably be the first appropriate step.  Meds ordered this encounter  Medications   predniSONE (DELTASONE) 20 MG tablet    Sig: 2 tabs po for 7 days, then 1 tab po for 7 days    Dispense:  21 tablet    Refill:  0   Medications Discontinued During This Encounter  Medication Reason   predniSONE (DELTASONE) 20 MG tablet Completed Course   No orders of the defined types were placed in this encounter.   Follow-up: Return in about 5 weeks (around 07/07/2021).  Dr[employer] One speech-to-text software was used for transcription in this dictation.  Possible transcriptional errors can occur using Editor, commissioning.   Signed,  [name], MD   Outpatient Encounter Medications as of 06/02/2021  Medication Sig   atorvastatin (LIPITOR) 40 MG tablet Take 1 tablet (40 mg total) by mouth daily. For cholesterol.   CALCIUM PO Take 1,000 mg by mouth daily.   Cholecalciferol (VITAMIN D PO) Take 1,000 Units by mouth daily.   omeprazole (PRILOSEC OTC) 20 MG tablet Take 20 mg by mouth daily.   predniSONE (DELTASONE) 20 MG tablet 2 tabs po for 7 days, then 1 tab po for 7 days   alendronate (FOSAMAX) 70 MG tablet Take 1 tablet by mouth once weekly on an empty stomach with water for osteoporosis. Do not lay down within 1 hour after taking. (Patient not taking: Reported on 05/13/2021)   tiZANidine (ZANAFLEX) 4 MG tablet Take 1 tablet (4 mg total) by mouth at bedtime as needed for muscle spasms. (Patient not taking: Reported on 06/02/2021)   [DISCONTINUED] predniSONE (DELTASONE) 20 MG tablet Take two tablets my mouth once daily for four days, then one tablet once daily for four days.   No facility-administered encounter medications on file as of 06/02/2021.

## 2021-06-17 ENCOUNTER — Encounter: Payer: Self-pay | Admitting: Family Medicine

## 2021-06-17 DIAGNOSIS — M5412 Radiculopathy, cervical region: Secondary | ICD-10-CM

## 2021-06-20 NOTE — Telephone Encounter (Signed)
ICD-10-CM   1. Cervical radiculopathy, acute  M54.12 Ambulatory referral to Physical Therapy     Orders Placed This Encounter  Procedures   Ambulatory referral to Physical Therapy

## 2021-06-24 ENCOUNTER — Other Ambulatory Visit: Payer: Self-pay | Admitting: Family Medicine

## 2021-06-24 ENCOUNTER — Encounter: Payer: Self-pay | Admitting: Family Medicine

## 2021-06-24 NOTE — Telephone Encounter (Signed)
Patient is requesting another round of prednisone.  Last office visit 06/02/2021 with Dr. Lorelei Pont for shoulder pain.  Last refilled 06/02/2021 for #21 with no refills.  Next Appt: 01/31/203 for follow up with Anda Kraft.

## 2021-06-25 ENCOUNTER — Other Ambulatory Visit (HOSPITAL_COMMUNITY): Payer: Self-pay

## 2021-06-25 MED ORDER — PREDNISONE 20 MG PO TABS
ORAL_TABLET | ORAL | 0 refills | Status: DC
  Filled 2021-06-25: qty 21, 14d supply, fill #0

## 2021-07-15 ENCOUNTER — Ambulatory Visit: Payer: PPO | Admitting: Primary Care

## 2021-07-22 DIAGNOSIS — K649 Unspecified hemorrhoids: Secondary | ICD-10-CM

## 2021-07-23 ENCOUNTER — Telehealth: Payer: Self-pay

## 2021-07-23 NOTE — Telephone Encounter (Signed)
Scheduled for 10/15/2021

## 2021-08-06 ENCOUNTER — Ambulatory Visit
Admission: RE | Admit: 2021-08-06 | Discharge: 2021-08-06 | Disposition: A | Discharge status: Home / Self Care | Payer: PPO | Source: Ambulatory Visit | Attending: Primary Care | Admitting: Primary Care

## 2021-08-06 ENCOUNTER — Other Ambulatory Visit: Payer: PPO

## 2021-08-06 ENCOUNTER — Other Ambulatory Visit: Payer: Self-pay

## 2021-08-06 DIAGNOSIS — Z1231 Encounter for screening mammogram for malignant neoplasm of breast: Secondary | ICD-10-CM | POA: Insufficient documentation

## 2021-08-18 ENCOUNTER — Other Ambulatory Visit: Payer: Self-pay

## 2021-08-18 ENCOUNTER — Ambulatory Visit (INDEPENDENT_AMBULATORY_CARE_PROVIDER_SITE_OTHER): Payer: PPO | Admitting: Family Medicine

## 2021-08-18 ENCOUNTER — Encounter: Payer: Self-pay | Admitting: Family Medicine

## 2021-08-18 VITALS — BP 120/68 | HR 64 | Temp 98.1°F | Ht 59.0 in | Wt 119.2 lb

## 2021-08-18 DIAGNOSIS — M5440 Lumbago with sciatica, unspecified side: Secondary | ICD-10-CM | POA: Diagnosis not present

## 2021-08-18 DIAGNOSIS — M5412 Radiculopathy, cervical region: Secondary | ICD-10-CM | POA: Diagnosis not present

## 2021-08-18 NOTE — Progress Notes (Signed)
? ? ?[name] T. [name], MD, [facility] ?Therapist, music at [facility] ?[place] ?[place], [address] ? ?Phone: [phone]  FAX: [phone] ? ?Kayla Ritter - 71 y.o. female  MRN [id number]  Date of Birth: 30-Jan-1951 ? ?Date: 08/18/2021  PCP: Pleas Koch, NP  Referral: Pleas Koch, NP ? ?Chief Complaint  ?Patient presents with  ? Follow-up  ?  Right Shoulder Pain  ? ? ?This visit occurred during the SARS-CoV-2 public health emergency.  Safety protocols were in place, including screening questions prior to the visit, additional usage of staff PPE, and extensive cleaning of exam room while observing appropriate contact time as indicated for disinfecting solutions.  ? ?Subjective:  ? ?Kayla Ritter is a 71 y.o. very pleasant female patient with Body mass index is 24.07 kg/m?. who presents with the following: ? ?Cervical rad f/u:  I did place her on some prednisone on her last OV. ? ?Shoulder blade is better with some rare tingling down her arms.  ? ?She actually is doing pretty dramatically better, at this point she is not having any significant radicular neck pain, shoulder blade pain, and she feels much better.  She is having some very rare tingling in her arms, but this is not consistent and dramatically better than before. ? ?She does also have some bilateral back pain, and she does have some occasional radicular pain down both legs.  She also does have some pain in the hip and buttocks region.  This has been ongoing for about 1 week. ? ?06/24/2021 Last OV with Owens Loffler, MD  ?She is having pain now and the right shoulder region, and she is having pain in the shoulder blade much of the time, and she is also having pain going down distally into the right arm.  She is not having any significant tingling, and no loss of sensation or function. ? ?She did get a course of some oral steroids, and this did help. ? ?I reviewed a prior MRI of the C-spine, and she  does have extensive spinal stenosis as well as foraminal stenosis throughout the cervical spine.  Prior to this, however, she was effectively asymptomatic despite some multiple chronic pathologies on her MRI. ?  ?She does not really have much significant pain with abduction or internal rotation. ?  ?MRI of shoulder and c-spine reviewed. ?  ?No tingling. ?No major issues R shoulder in the past.  ? ? ?Review of Systems is noted in the HPI, as appropriate ? ?Objective:  ? ?BP 120/68   Pulse 64   Temp 98.1 ?F (36.7 ?C) (Oral)   Ht '4\' 11"'$  (1.499 m)   Wt 119 lb 3 oz (54.1 kg)   SpO2 97%   BMI 24.07 kg/m?  ? ?GEN: No acute distress; alert,appropriate. ?PULM: Breathing comfortably in no respiratory distress ?PSYCH: Normally interactive.  ? ? ?GEN: alert,appropriate ?PSYCH: Normally interactive. Cooperative during the interview.  ? ?CERVICAL SPINE EXAM ?Range of motion: Flexion, extension, lateral bending, and rotation: Gross loss of 10% of motion ?Pain with terminal motion: Minimal ?Spinous Processes: NT ?SCM: NT ?Upper paracervical muscles: Nontender ?Upper traps: NT ?C5-T1 intact, sensation and motor  ? ?Full range of motion of the shoulder, strength 5/5, Jobe and [employer] test do not cause any pain. ? ?She does have some tenderness in the lower back and erector spinae complex, but lower extremity is entirely neurovascularly intact. ? ?Laboratory and Imaging Data: ? ?Assessment and Plan:  ? ?  ICD-10-CM   ?1. Cervical radiculopathy, acute  M54.12   ?  ?2. Acute bilateral low back pain with sciatica, sciatica laterality unspecified  M54.40   ?  ? ?Thankfully, her longstanding neck, shoulder blade, and radicular pain is much improved after formal physical therapy.  At this point, I would really not do anything except for her to continue her rehab, range of motion and strengthening. ? ?She does have some back pain for 1 week.  I did give her some additional back and core work.  Very likely this will improve  without additional intervention. ? ?If her symptoms and radicular pain do worsen, then neuropathic pain medication would be reasonable, but she and I both think that this was not necessary right now. ? ?No orders of the defined types were placed in this encounter. ? ?Medications Discontinued During This Encounter  ?Medication Reason  ? tiZANidine (ZANAFLEX) 4 MG tablet No longer needed (for PRN medications)  ? predniSONE (DELTASONE) 20 MG tablet Completed Course  ? ?No orders of the defined types were placed in this encounter. ? ? ?Follow-up: No follow-ups on file. ? ?Dragon [employer] One speech-to-text software was used for transcription in this dictation.  Possible transcriptional errors can occur using Editor, commissioning.  ? ?Signed, ? ?Reign Dziuba T. Develle Sievers, MD ? ? ?Outpatient Encounter Medications as of 08/18/2021  ?Medication Sig  ? alendronate (FOSAMAX) 70 MG tablet Take 1 tablet by mouth once weekly on an empty stomach with water for osteoporosis. Do not lay down within 1 hour after taking.  ? atorvastatin (LIPITOR) 40 MG tablet Take 1 tablet (40 mg total) by mouth daily. For cholesterol.  ? CALCIUM PO Take 1,000 mg by mouth daily.  ? Cholecalciferol (VITAMIN D PO) Take 1,000 Units by mouth daily.  ? omeprazole (PRILOSEC OTC) 20 MG tablet Take 20 mg by mouth daily.  ? [DISCONTINUED] predniSONE (DELTASONE) 20 MG tablet Take 2 tablets by mouth daily for 7 days then 1 tablet by mouth daily for 7 days  ? [DISCONTINUED] tiZANidine (ZANAFLEX) 4 MG tablet Take 1 tablet (4 mg total) by mouth at bedtime as needed for muscle spasms. (Patient not taking: Reported on 06/02/2021)  ? ?No facility-administered encounter medications on file as of 08/18/2021.  ?  ?

## 2021-08-20 ENCOUNTER — Ambulatory Visit
Admission: RE | Admit: 2021-08-20 | Discharge: 2021-08-20 | Disposition: A | Discharge status: Home / Self Care | Payer: PPO | Source: Ambulatory Visit | Attending: Primary Care | Admitting: Primary Care

## 2021-08-20 ENCOUNTER — Other Ambulatory Visit: Payer: Self-pay

## 2021-08-20 DIAGNOSIS — E2839 Other primary ovarian failure: Secondary | ICD-10-CM | POA: Insufficient documentation

## 2021-08-20 DIAGNOSIS — M810 Age-related osteoporosis without current pathological fracture: Secondary | ICD-10-CM

## 2021-08-20 MED ORDER — ALENDRONATE SODIUM 70 MG PO TABS: ORAL_TABLET | ORAL | 3 refills | Status: DC

## 2021-10-15 ENCOUNTER — Ambulatory Visit: Payer: PPO | Admitting: Gastroenterology

## 2021-10-15 ENCOUNTER — Encounter: Payer: Self-pay | Admitting: Gastroenterology

## 2021-10-15 ENCOUNTER — Other Ambulatory Visit: Payer: Self-pay

## 2021-10-15 VITALS — BP 134/82 | HR 71 | Temp 98.2°F | Ht 59.0 in | Wt 116.1 lb

## 2021-10-15 DIAGNOSIS — K625 Hemorrhage of anus and rectum: Secondary | ICD-10-CM | POA: Diagnosis not present

## 2021-10-15 DIAGNOSIS — K529 Noninfective gastroenteritis and colitis, unspecified: Secondary | ICD-10-CM | POA: Diagnosis not present

## 2021-10-15 MED ORDER — NA SULFATE-K SULFATE-MG SULF 17.5-3.13-1.6 GM/177ML PO SOLN: 354.0000 mL | Freq: Once | ORAL | 0 refills | Status: AC

## 2021-10-15 NOTE — Progress Notes (Signed)
?  ?Kayla Darby, MD ?[address]  ?[address]  ?[place], [place] [address]  ?Main: [phone]  ?Fax: [phone] ? ? ? ?Gastroenterology Consultation ? ?Referring Provider:     Pleas Koch, NP ?Primary Care Physician:  Pleas Koch, NP ?Primary Gastroenterologist:  Dr. Cephas Ritter ?Reason for Consultation:     Symptomatic hemorrhoids, chronic diarrhea ?      ? HPI:   ?Kayla Ritter is a 71 y.o. female referred by Dr. Carlis Abbott, Leticia Penna, NP  for consultation & management of symptomatic hemorrhoids.  Patient reports that since August last year she has been experiencing bright red blood per rectum on wiping associated with perianal itching, burning, pressure, CPH.  Patient is started on hydrocortisone suppository which is helping with her symptoms significantly.  She also reports that for approximately 1 year patient has been experiencing nonbloody diarrhea, bowel movements anywhere from 4 to 5/day, may or may not be related to food, denies any nocturnal diarrhea, symptoms associated with urgency, denies any abdominal pain or bloating.  Patient denies any weight loss.  She does follow intermittent fasting.  Her labs including CBC, CMP, TSH are unremarkable.  Patient reports undergoing EGD and colonoscopy 7 years ago and colon polyps were removed ?Patient has not tried Imodium for her diarrhea ? ?Patient lives with her husband and takes care of her father.  Denies any stress in her life or significant events that occurred in her life within last 1 year ? ?NSAIDs: None ? ?Antiplts/Anticoagulants/Anti thrombotics: None ? ?GI Procedures: EGD and colonoscopy 7 years ago, colon polyps were removed ? ?Past Medical History:  ?Diagnosis Date  ? Chickenpox   ? Genital warts   ? GERD (gastroesophageal reflux disease)   ? Hyperlipidemia   ? Urinary tract infection   ? ? ?Past Surgical History:  ?Procedure Laterality Date  ? CATARACT EXTRACTION Right 08/29/2019  ? CATARACT EXTRACTION Left 09/19/2019   ? ?Current Outpatient Medications:  ?  alendronate (FOSAMAX) 70 MG tablet, Take 1 tablet by mouth once weekly on an empty stomach with water for bone density. Do not lay down within 1 hour after taking., Disp: 12 tablet, Rfl: 3 ?  atorvastatin (LIPITOR) 40 MG tablet, Take 1 tablet (40 mg total) by mouth daily. For cholesterol., Disp: 90 tablet, Rfl: 3 ?  CALCIUM PO, Take 1,000 mg by mouth daily., Disp: , Rfl:  ?  Cholecalciferol (VITAMIN D PO), Take 1,000 Units by mouth daily., Disp: , Rfl:  ?  Na Sulfate-K Sulfate-Mg Sulf 17.5-3.13-1.6 GM/177ML SOLN, Take 354 mLs by mouth once for 1 dose., Disp: 354 mL, Rfl: 0 ?  omeprazole (PRILOSEC OTC) 20 MG tablet, Take 20 mg by mouth daily., Disp: , Rfl:  ? ? ? ?Family History  ?Problem Relation Age of Onset  ? Arthritis Mother   ? Diabetes Mother   ? Hearing loss Mother   ? Hypertension Mother   ? Kidney cancer Mother   ? Hearing loss Father   ? Stomach cancer Father   ? Asthma Brother   ? Breast cancer Neg Hx   ?  ? ?Social History  ? ?Tobacco Use  ? Smoking status: Former  ? Smokeless tobacco: Never  ?Vaping Use  ? Vaping Use: Never used  ?Substance Use Topics  ? Alcohol use: Yes  ?  Comment: 1 glass of wine per month  ? Drug use: Not Currently  ? ? ?Allergies as of 10/15/2021 - Review Complete 10/15/2021  ?Allergen Reaction  Noted  ? Codeine    ? ? ?Review of Systems:    ?All systems reviewed and negative except where noted in HPI. ? ? Physical Exam:  ?BP 134/82 (BP Location: Left Arm, Patient Position: Sitting, Cuff Size: Normal)   Pulse 71   Temp 98.2 ?F (36.8 ?C) (Oral)   Ht '4\' 11"'$  (1.499 m)   Wt 116 lb 2 oz (52.7 kg)   BMI 23.45 kg/m?  ?No LMP recorded. Patient is postmenopausal. ? ?General:   Alert,  Well-developed, well-nourished, pleasant and cooperative in NAD ?Head:  Normocephalic and atraumatic. ?Eyes:  Sclera clear, no icterus.   Conjunctiva pink. ?Ears:  Normal auditory acuity. ?Nose:  No deformity, discharge, or lesions. ?Mouth:  No deformity or  lesions,oropharynx pink & moist. ?Neck:  Supple; no masses or thyromegaly. ?Lungs:  Respirations even and unlabored.  Clear throughout to auscultation.   No wheezes, crackles, or rhonchi. No acute distress. ?Heart:  Regular rate and rhythm; no murmurs, clicks, rubs, or gallops. ?Abdomen:  Normal bowel sounds. Soft, non-tender and non-distended without masses, hepatosplenomegaly or hernias noted.  No guarding or rebound tenderness.   ?Rectal: Not performed ?Msk:  Symmetrical without gross deformities. Good, equal movement & strength bilaterally. ?Pulses:  Normal pulses noted. ?Extremities:  No clubbing or edema.  No cyanosis. ?Neurologic:  Alert and oriented x3;  grossly normal neurologically. ?Skin:  Intact without significant lesions or rashes. No jaundice. ?Psych:  Alert and cooperative. Normal mood and affect. ? ?Imaging Studies: ?No abdominal imaging ? ?Assessment and Plan:  ? ?[name] is a 71 y.o. pleasant Caucasian female with history of osteopenia is seen in consultation for chronic diarrhea and bright red blood per rectum on wiping associated with rectal discomfort, perianal itching ? ?Recommend GI profile PCR to rule out infection ?Check pancreatic fecal elastase levels ?Recommend EGD and colonoscopy with biopsies and possible TI evaluation ? ? ?Follow up in 3 months ? ? ?Kayla Darby, MD ? ?

## 2021-10-24 LAB — GI PROFILE, STOOL, PCR

## 2021-10-24 LAB — PANCREATIC ELASTASE, FECAL: Pancreatic Elastase, Fecal: <50 ug Elast./g — ABNORMAL LOW (ref >200)

## 2021-10-31 ENCOUNTER — Telehealth: Payer: Self-pay

## 2021-10-31 MED ORDER — PANCRELIPASE (LIP-PROT-AMYL) 36000-114000 UNITS PO CPEP: ORAL_CAPSULE | ORAL | 5 refills | Status: DC

## 2021-10-31 NOTE — Telephone Encounter (Signed)
Patient verbalized understanding of results. Patient will try the creon. Sent medication to the pharmacy.

## 2021-10-31 NOTE — Telephone Encounter (Signed)
Called and left a message for call back  

## 2021-10-31 NOTE — Telephone Encounter (Signed)
-----   Message from Lin Landsman, MD sent at 10/30/2021  4:18 PM EDT ----- Stool studies are negative for infection.  Pancreatic fecal elastase levels are low suggestive of exocrine pancreatic insufficiency.  Recommend trial of Creon or Zenpep 2 capsules with each meal and 1 with snack. I still recommend her to undergo upper endoscopy and colonoscopy for further evaluation of chronic diarrhea  Kayla Ritter

## 2021-11-04 ENCOUNTER — Other Ambulatory Visit: Payer: Self-pay

## 2021-11-04 ENCOUNTER — Encounter
Admission: RE | Disposition: A | Discharge status: Home / Self Care | Payer: Self-pay | Source: Home / Self Care | Attending: Gastroenterology

## 2021-11-04 ENCOUNTER — Encounter: Payer: Self-pay | Admitting: Gastroenterology

## 2021-11-04 ENCOUNTER — Ambulatory Visit
Admission: RE | Admit: 2021-11-04 | Discharge: 2021-11-04 | Disposition: A | Discharge status: Home / Self Care | Payer: PPO | Attending: Gastroenterology | Admitting: Gastroenterology

## 2021-11-04 ENCOUNTER — Ambulatory Visit: Payer: PPO | Admitting: Registered Nurse

## 2021-11-04 ENCOUNTER — Telehealth: Payer: Self-pay

## 2021-11-04 DIAGNOSIS — K644 Residual hemorrhoidal skin tags: Secondary | ICD-10-CM | POA: Diagnosis not present

## 2021-11-04 DIAGNOSIS — K529 Noninfective gastroenteritis and colitis, unspecified: Secondary | ICD-10-CM | POA: Diagnosis present

## 2021-11-04 DIAGNOSIS — K648 Other hemorrhoids: Secondary | ICD-10-CM | POA: Insufficient documentation

## 2021-11-04 DIAGNOSIS — E785 Hyperlipidemia, unspecified: Secondary | ICD-10-CM | POA: Insufficient documentation

## 2021-11-04 DIAGNOSIS — K219 Gastro-esophageal reflux disease without esophagitis: Secondary | ICD-10-CM | POA: Insufficient documentation

## 2021-11-04 DIAGNOSIS — D120 Benign neoplasm of cecum: Secondary | ICD-10-CM | POA: Insufficient documentation

## 2021-11-04 DIAGNOSIS — K5730 Diverticulosis of large intestine without perforation or abscess without bleeding: Secondary | ICD-10-CM | POA: Diagnosis not present

## 2021-11-04 DIAGNOSIS — K635 Polyp of colon: Secondary | ICD-10-CM | POA: Diagnosis not present

## 2021-11-04 DIAGNOSIS — Z87891 Personal history of nicotine dependence: Secondary | ICD-10-CM | POA: Insufficient documentation

## 2021-11-04 HISTORY — PX: COLONOSCOPY WITH PROPOFOL: SHX5780

## 2021-11-04 HISTORY — PX: ESOPHAGOGASTRODUODENOSCOPY (EGD) WITH PROPOFOL: SHX5813

## 2021-11-04 SURGERY — COLONOSCOPY WITH PROPOFOL
Anesthesia: General

## 2021-11-04 MED ORDER — SODIUM CHLORIDE 0.9 % IV SOLN: INTRAVENOUS | Status: DC

## 2021-11-04 MED ORDER — DEXMEDETOMIDINE (PRECEDEX) IN NS 20 MCG/5ML (4 MCG/ML) IV SYRINGE
PREFILLED_SYRINGE | INTRAVENOUS | Status: DC | PRN
  Administered 2021-11-04: 8 ug via INTRAVENOUS

## 2021-11-04 MED ORDER — GLYCOPYRROLATE 0.2 MG/ML IJ SOLN
INTRAMUSCULAR | Status: DC | PRN
  Administered 2021-11-04: .2 mg via INTRAVENOUS

## 2021-11-04 MED ORDER — PROPOFOL 10 MG/ML IV BOLUS
INTRAVENOUS | Status: DC | PRN
  Administered 2021-11-04: 30 mg via INTRAVENOUS
  Administered 2021-11-04: 60 mg via INTRAVENOUS

## 2021-11-04 MED ORDER — PROPOFOL 500 MG/50ML IV EMUL
INTRAVENOUS | Status: DC | PRN
  Administered 2021-11-04: 140 ug/kg/min via INTRAVENOUS

## 2021-11-04 MED ORDER — LIDOCAINE HCL (CARDIAC) PF 100 MG/5ML IV SOSY
PREFILLED_SYRINGE | INTRAVENOUS | Status: DC | PRN
  Administered 2021-11-04: 60 mg via INTRAVENOUS

## 2021-11-04 NOTE — Op Note (Signed)
[facility] Gastroenterology Patient Name: Kayla Ritter Procedure Date: 11/04/2021 7:46 AM MRN: [id number] Account #: [id number] Date of Birth: 05-15-1951 Admit Type: Outpatient Age: 71 Room: [facility] ENDO ROOM 3 Gender: Female Note Status: Finalized Instrument Name: Upper Endoscope [id number] Procedure:             Upper GI endoscopy Indications:           Diarrhea Providers:             Lin Landsman MD, MD Referring MD:          Pleas Koch (Referring MD) Medicines:             General Anesthesia Complications:         No immediate complications. Estimated blood loss: None. Procedure:             Pre-Anesthesia Assessment:                        - Prior to the procedure, a History and Physical was                         performed, and patient medications and allergies were                         reviewed. The patient is competent. The risks and                         benefits of the procedure and the sedation options and                         risks were discussed with the patient. All questions                         were answered and informed consent was obtained.                         Patient identification and proposed procedure were                         verified by the physician, the nurse, the                         anesthesiologist, the anesthetist and the technician                         in the pre-procedure area in the procedure room in the                         endoscopy suite. Mental Status Examination: alert and                         oriented. Airway Examination: normal oropharyngeal                         airway and neck mobility. Respiratory Examination:                         clear to auscultation. CV Examination: normal.  Prophylactic Antibiotics: The patient does not require                         prophylactic antibiotics. Prior Anticoagulants: The                         patient has taken no  previous anticoagulant or                         antiplatelet agents. ASA Grade Assessment: III - A                         patient with severe systemic disease. After reviewing                         the risks and benefits, the patient was deemed in                         satisfactory condition to undergo the procedure. The                         anesthesia plan was to use general anesthesia.                         Immediately prior to administration of medications,                         the patient was re-assessed for adequacy to receive                         sedatives. The heart rate, respiratory rate, oxygen                         saturations, blood pressure, adequacy of pulmonary                         ventilation, and response to care were monitored                         throughout the procedure. The physical status of the                         patient was re-assessed after the procedure.                        After obtaining informed consent, the endoscope was                         passed under direct vision. Throughout the procedure,                         the patient's blood pressure, pulse, and oxygen                         saturations were monitored continuously. The Endoscope                         was introduced through the mouth, and advanced to the  second part of duodenum. The upper GI endoscopy was                         accomplished without difficulty. The patient tolerated                         the procedure well. Findings:      The duodenal bulb and second portion of the duodenum were normal.       Biopsies were taken with a cold forceps for histology.      The entire examined stomach was normal. Biopsies were taken with a cold       forceps for histology.      The cardia and gastric fundus were normal on retroflexion.      The gastroesophageal junction and examined esophagus were normal.      Esophagogastric landmarks  were identified: the gastroesophageal junction       was found at 34 cm from the incisors. Impression:            - Normal duodenal bulb and second portion of the                         duodenum. Biopsied.                        - Normal stomach. Biopsied.                        - Normal gastroesophageal junction and esophagus.                        - Esophagogastric landmarks identified. Recommendation:        - Await pathology results.                        - Proceed with colonoscopy as scheduled                        See colonoscopy report Procedure Code(s):     --- Professional ---                        [phone], Esophagogastroduodenoscopy, flexible,                         transoral; with biopsy, single or multiple Diagnosis Code(s):     --- Professional ---                        R19.7, Diarrhea, unspecified CPT copyright 2019 American Medical Association. All rights reserved. The codes documented in this report are preliminary and upon coder review may  be revised to meet current compliance requirements. Dr. Ulyess Mort Lin Landsman MD, MD 11/04/2021 7:59:24 AM This report has been signed electronically. Number of Addenda: 0 Note Initiated On: 11/04/2021 7:46 AM Estimated Blood Loss:  Estimated blood loss: none.      [facility]

## 2021-11-04 NOTE — Transfer of Care (Signed)
Immediate Anesthesia Transfer of Care Note  Patient: Kayla Ritter  Procedure(s) Performed: COLONOSCOPY WITH PROPOFOL ESOPHAGOGASTRODUODENOSCOPY (EGD) WITH PROPOFOL  Patient Location: PACU  Anesthesia Type:General  Level of Consciousness: awake, alert  and oriented  Airway & Oxygen Therapy: Patient Spontanous Breathing  Post-op Assessment: Report given to RN and Post -op Vital signs reviewed and stable  Post vital signs: Reviewed and stable  Last Vitals:  Vitals Value Taken Time  BP 100/59 11/04/21 0821  Temp    Pulse 70 11/04/21 0820  Resp 14 11/04/21 0820  SpO2 95 % 11/04/21 0820  Vitals shown include unvalidated device data.  Last Pain:  Vitals:   11/04/21 0810  TempSrc: Temporal  PainSc:          Complications: No notable events documented.

## 2021-11-04 NOTE — Telephone Encounter (Signed)
Patient had procedure done today will come tomorrow

## 2021-11-04 NOTE — H&P (Signed)
Cephas Darby, MD [address]  [place]  [place], [place] [address]  Main: [phone]  Fax: [phone] Pager: [phone]  Primary Care Physician:  Pleas Koch, NP Primary Gastroenterologist:  Dr. Cephas Darby  Pre-Procedure History & Physical: HPI:  Kayla Ritter is a 71 y.o. female is here for an endoscopy and colonoscopy.   Past Medical History:  Diagnosis Date   Chickenpox    Genital warts    GERD (gastroesophageal reflux disease)    Hyperlipidemia    Urinary tract infection     Past Surgical History:  Procedure Laterality Date   CATARACT EXTRACTION Right 08/29/2019   CATARACT EXTRACTION Left 09/19/2019   COLONOSCOPY WITH ESOPHAGOGASTRODUODENOSCOPY (EGD)      Prior to Admission medications   Medication Sig Start Date End Date Taking? Authorizing Provider  alendronate (FOSAMAX) 70 MG tablet Take 1 tablet by mouth once weekly on an empty stomach with water for bone density. Do not lay down within 1 hour after taking. 08/20/21  Yes Pleas Koch, NP  atorvastatin (LIPITOR) 40 MG tablet Take 1 tablet (40 mg total) by mouth daily. For cholesterol. 05/30/21  Yes Pleas Koch, NP  CALCIUM PO Take 1,000 mg by mouth daily.   Yes [provider]  Cholecalciferol (VITAMIN D PO) Take 1,000 Units by mouth daily.   Yes [provider]  lipase/protease/amylase (CREON) 36000 UNITS CPEP capsule Take 2 capsules with the first bite of each meal and 1 capsule with the first bite of each snack 10/31/21  Yes Jetson Pickrel, Tally Due, MD  omeprazole (PRILOSEC OTC) 20 MG tablet Take 20 mg by mouth daily.   Yes [provider]    Allergies as of 10/15/2021 - Review Complete 10/15/2021  Allergen Reaction Noted   Codeine      Family History  Problem Relation Age of Onset   Arthritis Mother    Diabetes Mother    Hearing loss Mother    Hypertension Mother    Kidney cancer Mother    Hearing loss Father    Stomach cancer Father     Asthma Brother    Breast cancer Neg Hx     Social History   Socioeconomic History   Marital status: Married    Spouse name: Not on file   Number of children: Not on file   Years of education: Not on file   Highest education level: Not on file  Occupational History   Not on file  Tobacco Use   Smoking status: Former   Smokeless tobacco: Never  Vaping Use   Vaping Use: Never used  Substance and Sexual Activity   Alcohol use: Yes    Comment: 1 glass of wine per month   Drug use: Not Currently   Sexual activity: Not on file  Other Topics Concern   Not on file  Social History Narrative   ** Merged History Encounter **       Married. 4 children, 5 grandchildren.  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  Enjoys reading, crossword puzzles, spending time with grandchildren.    Social Determinants of Health   Financial Resource Strain: Low Risk    Difficulty of Paying Living Expenses: Not hard at all  Food Insecurity: No Food Insecurity   Worried About Charity fundraiser in the Last Year: Never true   Ran Out of Food in the Last Year: Never true  Transportation Needs: No Transportation Needs   Lack  of Transportation (Medical): No   Lack of Transportation (Non-Medical): No  Physical Activity: Sufficiently Active   Days of Exercise per Week: 7 days   Minutes of Exercise per Session: 30 min  Stress: No Stress Concern Present   Feeling of Stress : Not at all  Social Connections: Socially Integrated   Frequency of Communication with Friends and Family: More than three times a week   Frequency of Social Gatherings with Friends and Family: Twice a week   Attends Religious Services: More than 4 times per year   Active Member of [employer] or Organizations: Yes   Attends Music therapist: More than 4 times per year   Marital Status: Married  Human resources officer Violence: Not At Risk   Fear of Current or Ex-Partner: No   Emotionally Abused: No    Physically Abused: No   Sexually Abused: No    Review of Systems: See HPI, otherwise negative ROS  Physical Exam: BP 125/80   Pulse 61   Temp (!) 96.8 F (36 C) (Temporal)   Resp 18   SpO2 100%  General:   Alert,  pleasant and cooperative in NAD Head:  Normocephalic and atraumatic. Neck:  Supple; no masses or thyromegaly. Lungs:  Clear throughout to auscultation.    Heart:  Regular rate and rhythm. Abdomen:  Soft, nontender and nondistended. Normal bowel sounds, without guarding, and without rebound.   Neurologic:  Alert and  oriented x4;  grossly normal neurologically.  Impression/Plan: Kayla Ritter is here for an endoscopy and colonoscopy to be performed for chronic diarrhea  Risks, benefits, limitations, and alternatives regarding  endoscopy and colonoscopy have been reviewed with the patient.  Questions have been answered.  All parties agreeable.   Sherri Sear, MD  11/04/2021, 7:40 AM

## 2021-11-04 NOTE — Anesthesia Postprocedure Evaluation (Signed)
Anesthesia Post Note  Patient: [name]  Procedure(s) Performed: COLONOSCOPY WITH PROPOFOL ESOPHAGOGASTRODUODENOSCOPY (EGD) WITH PROPOFOL  Patient location during evaluation: Endoscopy Anesthesia Type: General Level of consciousness: awake and alert Pain management: pain level controlled Vital Signs Assessment: post-procedure vital signs reviewed and stable Respiratory status: spontaneous breathing, nonlabored ventilation, respiratory function stable and patient connected to nasal cannula oxygen Cardiovascular status: blood pressure returned to baseline and stable Postop Assessment: no apparent nausea or vomiting Anesthetic complications: no   No notable events documented.   Last Vitals:  Vitals:   11/04/21 0830 11/04/21 0840  BP: 90/61 97/70  Pulse: 74 78  Resp: 16 13  Temp:    SpO2: 97% 98%    Last Pain:  Vitals:   11/04/21 0810  TempSrc: Temporal  PainSc:                  Precious Haws Amilya Haver

## 2021-11-04 NOTE — Anesthesia Preprocedure Evaluation (Signed)
Anesthesia Evaluation  Patient identified by MRN, date of birth, ID band Patient awake    Reviewed: Allergy & Precautions, NPO status , Patient's Chart, lab work & pertinent test results  Airway Mallampati: III  TM Distance: >3 FB Neck ROM: full    Dental  (+) Upper Dentures, Lower Dentures   Pulmonary neg shortness of breath, former smoker,    Pulmonary exam normal        Cardiovascular negative cardio ROS Normal cardiovascular exam     Neuro/Psych negative neurological ROS  negative psych ROS   GI/Hepatic negative GI ROS, Neg liver ROS,   Endo/Other  negative endocrine ROS  Renal/GU negative Renal ROS  negative genitourinary   Musculoskeletal   Abdominal   Peds  Hematology negative hematology ROS (+)   Anesthesia Other Findings Past Medical History: No date: Chickenpox No date: Genital warts No date: GERD (gastroesophageal reflux disease) No date: Hyperlipidemia No date: Urinary tract infection  Past Surgical History: 08/29/2019: CATARACT EXTRACTION; Right 09/19/2019: CATARACT EXTRACTION; Left No date: COLONOSCOPY WITH ESOPHAGOGASTRODUODENOSCOPY (EGD)     Reproductive/Obstetrics negative OB ROS                             Anesthesia Physical Anesthesia Plan  ASA: 3  Anesthesia Plan: General   Post-op Pain Management:    Induction: Intravenous  PONV Risk Score and Plan: Propofol infusion and TIVA  Airway Management Planned: Natural Airway and Nasal Cannula  Additional Equipment:   Intra-op Plan:   Post-operative Plan:   Informed Consent: I have reviewed the patients History and Physical, chart, labs and discussed the procedure including the risks, benefits and alternatives for the proposed anesthesia with the patient or authorized representative who has indicated his/her understanding and acceptance.     Dental Advisory Given  Plan Discussed with:  Anesthesiologist, CRNA and Surgeon  Anesthesia Plan Comments: (Patient consented for risks of anesthesia including but not limited to:  - adverse reactions to medications - risk of airway placement if required - damage to eyes, teeth, lips or other oral mucosa - nerve damage due to positioning  - sore throat or hoarseness - Damage to heart, brain, nerves, lungs, other parts of body or loss of life  Patient voiced understanding.)        Anesthesia Quick Evaluation

## 2021-11-04 NOTE — Op Note (Signed)
[facility] Gastroenterology Patient Name: Kayla Ritter Procedure Date: 11/04/2021 7:45 AM MRN: [id number] Account #: [id number] Date of Birth: [birth year] Admit Type: Outpatient Age: 71 Room: [facility] ENDO ROOM 3 Gender: Female Note Status: Finalized Instrument Name: Colonoscope [id number] Procedure:             Colonoscopy Indications:           Chronic diarrhea Providers:             Lin Landsman MD, MD Referring MD:          Pleas Koch (Referring MD) Medicines:             General Anesthesia Complications:         No immediate complications. Estimated blood loss: None. Procedure:             Pre-Anesthesia Assessment:                        - Prior to the procedure, a History and Physical was                         performed, and patient medications and allergies were                         reviewed. The patient is competent. The risks and                         benefits of the procedure and the sedation options and                         risks were discussed with the patient. All questions                         were answered and informed consent was obtained.                         Patient identification and proposed procedure were                         verified by the physician, the nurse, the                         anesthesiologist, the anesthetist and the technician                         in the pre-procedure area in the procedure room in the                         endoscopy suite. Mental Status Examination: alert and                         oriented. Airway Examination: normal oropharyngeal                         airway and neck mobility. Respiratory Examination:                         clear to auscultation. CV Examination: normal.  Prophylactic Antibiotics: The patient does not require                         prophylactic antibiotics. Prior Anticoagulants: The                         patient has taken no previous  anticoagulant or                         antiplatelet agents. ASA Grade Assessment: III - A                         patient with severe systemic disease. After reviewing                         the risks and benefits, the patient was deemed in                         satisfactory condition to undergo the procedure. The                         anesthesia plan was to use general anesthesia.                         Immediately prior to administration of medications,                         the patient was re-assessed for adequacy to receive                         sedatives. The heart rate, respiratory rate, oxygen                         saturations, blood pressure, adequacy of pulmonary                         ventilation, and response to care were monitored                         throughout the procedure. The physical status of the                         patient was re-assessed after the procedure.                        After obtaining informed consent, the colonoscope was                         passed under direct vision. Throughout the procedure,                         the patient's blood pressure, pulse, and oxygen                         saturations were monitored continuously. The                         Colonoscope was introduced through the anus and  advanced to the the terminal ileum, with                         identification of the appendiceal orifice and IC                         valve. The colonoscopy was performed without                         difficulty. The patient tolerated the procedure well.                         The quality of the bowel preparation was evaluated                         using the BBPS [facility] Bowel Preparation Scale) with                         scores of: Right Colon = 3, Transverse Colon = 3 and                         Left Colon = 3 (entire mucosa seen well with no                         residual staining, small  fragments of stool or opaque                         liquid). The total BBPS score equals 9. Findings:      Skin tags were found on perianal exam.      The terminal ileum appeared normal.      A 5 mm polyp was found in the cecum. The polyp was sessile. The polyp       was removed with a cold snare. Resection and retrieval were complete.       Estimated blood loss: none.      Normal mucosa was found in the entire colon. Biopsies for histology were       taken with a cold forceps from the entire colon for evaluation of       microscopic colitis.      Multiple small-mouthed diverticula were found in the recto-sigmoid colon       and sigmoid colon.      Non-bleeding external and internal hemorrhoids were found during       retroflexion. The hemorrhoids were medium-sized. Impression:            - Perianal skin tags found on perianal exam.                        - The examined portion of the ileum was normal.                        - One 5 mm polyp in the cecum, removed with a cold                         snare. Resected and retrieved.                        - Normal mucosa in the entire  examined colon. Biopsied.                        - Diverticulosis in the recto-sigmoid colon and in the                         sigmoid colon.                        - Non-bleeding external and internal hemorrhoids. Recommendation:        - Discharge patient to home (with escort).                        - Resume previous diet today.                        - Continue present medications.                        - Await pathology results.                        - Return to my office as previously scheduled. Procedure Code(s):     --- Professional ---                        [phone], Colonoscopy, flexible; with removal of                         tumor(s), polyp(s), or other lesion(s) by snare                         technique                        45380, 35, Colonoscopy, flexible; with biopsy, single                          or multiple Diagnosis Code(s):     --- Professional ---                        K64.8, Other hemorrhoids                        K63.5, Polyp of colon                        K64.4, Residual hemorrhoidal skin tags                        K57.30, Diverticulosis of large intestine without                         perforation or abscess without bleeding                        K52.9, Noninfective gastroenteritis and colitis,                         unspecified CPT copyright 2019 American Medical Association. All rights reserved. The codes documented in this report are preliminary and upon coder review may  be revised to meet current compliance requirements.  Dr. Ulyess Mort Lin Landsman MD, MD 11/04/2021 8:18:51 AM This report has been signed electronically. Number of Addenda: 0 Note Initiated On: 11/04/2021 7:45 AM Scope Withdrawal Time: 0 hours 12 minutes 35 seconds  Total Procedure Duration: 0 hours 15 minutes 15 seconds  Estimated Blood Loss:  Estimated blood loss: none.      [facility]

## 2021-11-04 NOTE — Telephone Encounter (Signed)
-----   Message from Lin Landsman, MD sent at 11/04/2021  8:46 AM EDT ----- Regarding: banding Please bring her in for hemorrhoid banding, early next week, ok to overbook  RV

## 2021-11-05 ENCOUNTER — Encounter: Payer: Self-pay | Admitting: Gastroenterology

## 2021-11-05 LAB — SURGICAL PATHOLOGY

## 2021-11-05 NOTE — Telephone Encounter (Signed)
Called and left a message for call back  

## 2021-11-05 NOTE — Telephone Encounter (Signed)
Made appointment for 11/11/21 at 1:00

## 2021-11-06 ENCOUNTER — Telehealth: Payer: Self-pay

## 2021-11-06 DIAGNOSIS — R197 Diarrhea, unspecified: Secondary | ICD-10-CM

## 2021-11-06 NOTE — Telephone Encounter (Signed)
Patient verbalized understanding of results. She states she is okay to schedule the CT scan.Called and got patient schedule for 11/17/2021 Arrived at 9:45am for 10am  scan. Nothing to eat or drink 4 hours before the scan. Pick up contrast before the scan. Patient verbalized understanding of instructions

## 2021-11-06 NOTE — Telephone Encounter (Signed)
-----   Message from Lin Landsman, MD sent at 11/05/2021  6:19 PM EDT ----- Caryl Pina  Please inform patient that the pathology results came back normal.  Her chronic diarrhea is secondary to exocrine pancreatic insufficiency.  Recommend to continue pancreatic enzymes as instructed before.  Also, I recommend CT abdomen and pelvis pancreas protocol to evaluate for any pancreatic neoplasm  Rohini Vanga

## 2021-11-11 ENCOUNTER — Encounter: Payer: Self-pay | Admitting: Gastroenterology

## 2021-11-11 ENCOUNTER — Ambulatory Visit: Payer: PPO | Admitting: Gastroenterology

## 2021-11-11 VITALS — BP 146/83 | HR 83 | Temp 98.0°F | Ht 59.0 in | Wt 118.5 lb

## 2021-11-11 DIAGNOSIS — K8681 Exocrine pancreatic insufficiency: Secondary | ICD-10-CM

## 2021-11-11 DIAGNOSIS — K640 First degree hemorrhoids: Secondary | ICD-10-CM

## 2021-11-11 NOTE — Progress Notes (Signed)

## 2021-11-11 NOTE — Progress Notes (Signed)
Cephas Darby, MD [address]  [place]  [place], [place] [address]  Main: [phone]  Fax: [phone]    Gastroenterology Consultation  Referring Provider:     Pleas Koch, NP Primary Care Physician:  Pleas Koch, NP Primary Gastroenterologist:  Dr. Cephas Darby Reason for Consultation:     Symptomatic hemorrhoids, chronic diarrhea secondary to EPI        HPI:   Kayla Ritter is a 71 y.o. female referred by Dr. Carlis Abbott, Leticia Penna, NP  for consultation & management of symptomatic hemorrhoids.  Patient reports that since August last year she has been experiencing bright red blood per rectum on wiping associated with perianal itching, burning, pressure, CPH.  Patient is started on hydrocortisone suppository which is helping with her symptoms significantly.  She also reports that for approximately 1 year patient has been experiencing nonbloody diarrhea, bowel movements anywhere from 4 to 5/day, may or may not be related to food, denies any nocturnal diarrhea, symptoms associated with urgency, denies any abdominal pain or bloating.  Patient denies any weight loss.  She does follow intermittent fasting.  Her labs including CBC, CMP, TSH are unremarkable.  Patient reports undergoing EGD and colonoscopy 7 years ago and colon polyps were removed Patient has not tried Imodium for her diarrhea  Patient lives with her husband and takes care of her father.  Denies any stress in her life or significant events that occurred in her life within last 1 year  Follow-up visit 11/11/2021 Patient is here for follow-up of symptomatic hemorrhoids.  She underwent work-up of chronic diarrhea, found to have severe EPI, pancreatic fecal elastase levels were less than 50.  She underwent EGD and colonoscopy which were unremarkable.  Therefore, I started patient on pancreatic enzyme replacement therapy.  She is interested to undergo hemorrhoid ligation due to persistent hemorrhoidal symptoms  as mentioned above Patient has started taking Creon 36 K 2 capsules with each meal and 1 with snack about a week ago only, she has gained about 2 pounds and she reports feeling 50% better, less number of watery stools and frequency is down to 5 to 6/day  NSAIDs: None  Antiplts/Anticoagulants/Anti thrombotics: None  GI Procedures: EGD and colonoscopy 7 years ago, colon polyps were removed  EGD and colonoscopy 11/04/2021 - Normal duodenal bulb and second portion of the duodenum. Biopsied. - Normal stomach. Biopsied. - Normal gastroesophageal junction and esophagus. - Esophagogastric landmarks identified.  - Perianal skin tags found on perianal exam. - The examined portion of the ileum was normal. - One 5 mm polyp in the cecum, removed with a cold snare. Resected and retrieved. - Normal mucosa in the entire examined colon. Biopsied. - Diverticulosis in the recto-sigmoid colon and in the sigmoid colon. - Non-bleeding external and internal hemorrhoids.  DIAGNOSIS:  A. DUODENUM; COLD BIOPSY:  - ENTERIC MUCOSA WITH PRESERVED VILLOUS ARCHITECTURE AND NO SIGNIFICANT  HISTOPATHOLOGIC CHANGE.  - NEGATIVE FOR FEATURES OF CELIAC, DYSPLASIA, AND MALIGNANCY.   B. STOMACH; COLD BIOPSY:  - GASTRIC ANTRAL AND OXYNTIC MUCOSA WITH NO SIGNIFICANT HISTOPATHOLOGIC  CHANGE.  - NEGATIVE FOR H. PYLORI, DYSPLASIA, AND MALIGNANCY.   C. COLON POLYP, CECUM; COLD SNARE:  - TUBULAR ADENOMA.  - NEGATIVE FOR HIGH-GRADE DYSPLASIA AND MALIGNANCY.   D. COLON, RANDOM; COLD BIOPSY:  - BENIGN COLONIC MUCOSA WITH NO SIGNIFICANT HISTOPATHOLOGIC CHANGE.  - NEGATIVE FOR ACTIVE MUCOSAL COLITIS AND FEATURES OF MICROSCOPIC  COLITIS.  - NEGATIVE FOR DYSPLASIA AND MALIGNANCY.  Past Medical History:  Diagnosis Date   Chickenpox    Genital warts    GERD (gastroesophageal reflux disease)    Hyperlipidemia    Urinary tract infection     Past Surgical History:  Procedure Laterality Date   CATARACT EXTRACTION  Right 08/29/2019   CATARACT EXTRACTION Left 09/19/2019   COLONOSCOPY WITH ESOPHAGOGASTRODUODENOSCOPY (EGD)     COLONOSCOPY WITH PROPOFOL N/A 11/04/2021   Procedure: COLONOSCOPY WITH PROPOFOL;  Surgeon: Lin Landsman, MD;  Location: [facility];  Service: Gastroenterology;  Laterality: N/A;   ESOPHAGOGASTRODUODENOSCOPY (EGD) WITH PROPOFOL N/A 11/04/2021   Procedure: ESOPHAGOGASTRODUODENOSCOPY (EGD) WITH PROPOFOL;  Surgeon: Lin Landsman, MD;  Location: [facility] ENDOSCOPY;  Service: Gastroenterology;  Laterality: N/A;   Current Outpatient Medications:    alendronate (FOSAMAX) 70 MG tablet, Take 1 tablet by mouth once weekly on an empty stomach with water for bone density. Do not lay down within 1 hour after taking., Disp: 12 tablet, Rfl: 3   atorvastatin (LIPITOR) 40 MG tablet, Take 1 tablet (40 mg total) by mouth daily. For cholesterol., Disp: 90 tablet, Rfl: 3   CALCIUM PO, Take 1,000 mg by mouth daily., Disp: , Rfl:    Cholecalciferol (VITAMIN D PO), Take 1,000 Units by mouth daily., Disp: , Rfl:    lipase/protease/amylase (CREON) 36000 UNITS CPEP capsule, Take 2 capsules with the first bite of each meal and 1 capsule with the first bite of each snack, Disp: 240 capsule, Rfl: 5   omeprazole (PRILOSEC OTC) 20 MG tablet, Take 20 mg by mouth daily., Disp: , Rfl:     Family History  Problem Relation Age of Onset   Arthritis Mother    Diabetes Mother    Hearing loss Mother    Hypertension Mother    Kidney cancer Mother    Hearing loss Father    Stomach cancer Father    Asthma Brother    Breast cancer Neg Hx      Social History   Tobacco Use   Smoking status: Former   Smokeless tobacco: Never  Vaping Use   Vaping Use: Never used  Substance Use Topics   Alcohol use: Yes    Comment: 1 glass of wine per month   Drug use: Not Currently    Allergies as of 11/11/2021 - Review Complete 11/11/2021  Allergen Reaction Noted   Codeine      Review of Systems:    All systems  reviewed and negative except where noted in HPI.   Physical Exam:  BP (!) 146/83 (BP Location: Left Arm, Patient Position: Sitting, Cuff Size: Normal)   Pulse 83   Temp 98 F (36.7 C) (Oral)   Ht '4\' 11"'$  (1.499 m)   Wt 118 lb 8 oz (53.8 kg)   BMI 23.93 kg/m  No LMP recorded. Patient is postmenopausal.  General:   Alert,  Well-developed, well-nourished, pleasant and cooperative in NAD Head:  Normocephalic and atraumatic. Eyes:  Sclera clear, no icterus.   Conjunctiva pink. Ears:  Normal auditory acuity. Nose:  No deformity, discharge, or lesions. Mouth:  No deformity or lesions,oropharynx pink & moist. Neck:  Supple; no masses or thyromegaly. Lungs:  Respirations even and unlabored.  Clear throughout to auscultation.   No wheezes, crackles, or rhonchi. No acute distress. Heart:  Regular rate and rhythm; no murmurs, clicks, rubs, or gallops. Abdomen:  Normal bowel sounds. Soft, non-tender and non-distended without masses, hepatosplenomegaly or hernias noted.  No guarding or rebound tenderness.   Rectal: Not performed Msk:  Symmetrical without gross deformities. Good, equal movement & strength bilaterally. Pulses:  Normal pulses noted. Extremities:  No clubbing or edema.  No cyanosis. Neurologic:  Alert and oriented x3;  grossly normal neurologically. Skin:  Intact without significant lesions or rashes. No jaundice. Psych:  Alert and cooperative. Normal mood and affect.  Imaging Studies: No abdominal imaging  Assessment and Plan:   Kayla Ritter is a 71 y.o. pleasant Caucasian female with history of osteopenia is seen in consultation for chronic diarrhea and bright red blood per rectum on wiping associated with rectal discomfort, perianal itching  Chronic diarrhea secondary to severe EPI GI profile PCR for negative for infection Pancreatic fecal elastase levels was less than 50 EGD and colonoscopy including biopsies were unremarkable, no evidence of celiac disease, H. pylori  infection, IBD, microscopic colitis Started patient on pancreatic enzyme replacement therapy, creon 72 K with each meal and 36 K with snack CT abdomen and pelvis with contrast scheduled for 6/5  Grade 1 symptomatic hemorrhoids Patient is interested in hemorrhoid ligation.  Risks and benefits, procedure details discussed. Consent obtained Proceed with hemorrhoid ligation today   Follow up in 2 weeks  Cephas Darby, MD

## 2021-11-17 ENCOUNTER — Ambulatory Visit
Admission: RE | Admit: 2021-11-17 | Discharge: 2021-11-17 | Disposition: A | Discharge status: Home / Self Care | Payer: PPO | Source: Ambulatory Visit | Attending: Gastroenterology | Admitting: Gastroenterology

## 2021-11-17 DIAGNOSIS — R197 Diarrhea, unspecified: Secondary | ICD-10-CM | POA: Insufficient documentation

## 2021-11-17 DIAGNOSIS — I700 Atherosclerosis of aorta: Secondary | ICD-10-CM | POA: Insufficient documentation

## 2021-11-17 DIAGNOSIS — K5730 Diverticulosis of large intestine without perforation or abscess without bleeding: Secondary | ICD-10-CM | POA: Diagnosis not present

## 2021-11-17 LAB — POCT I-STAT CREATININE: Creatinine, Ser: 0.8 mg/dL (ref 0.44–1.00)

## 2021-11-17 MED ORDER — IOHEXOL 300 MG/ML  SOLN
80.0000 mL | Freq: Once | INTRAMUSCULAR | Status: AC | PRN
  Administered 2021-11-17: 80 mL via INTRAVENOUS

## 2021-11-18 ENCOUNTER — Telehealth: Payer: Self-pay

## 2021-11-18 NOTE — Telephone Encounter (Signed)
Patient verbalized understanding of results and will call PCP

## 2021-11-18 NOTE — Telephone Encounter (Signed)
-----   Message from Lin Landsman, MD sent at 11/17/2021  5:28 PM EDT ----- Caryl Pina  Please inform patient that the pancreas is normal.  Regarding 2.1 cm right adrenal nodule, patient has to follow-up with PCP  Belenda Cruise  Could you please take care of of the CT finding of right adrenal nodule?  Appreciate it  Rohini Vanga

## 2021-11-19 NOTE — Telephone Encounter (Signed)
Called patient had cancellation in the morning I have put on schedule.

## 2021-11-19 NOTE — Telephone Encounter (Signed)
Patient is calling in wanting to know if July 7th is okay to wait until July?

## 2021-11-20 ENCOUNTER — Ambulatory Visit (INDEPENDENT_AMBULATORY_CARE_PROVIDER_SITE_OTHER): Payer: PPO | Admitting: Primary Care

## 2021-11-20 ENCOUNTER — Encounter: Payer: Self-pay | Admitting: Primary Care

## 2021-11-20 VITALS — BP 134/76 | HR 78 | Temp 98.6°F | Ht 59.0 in | Wt 120.0 lb

## 2021-11-20 DIAGNOSIS — E279 Disorder of adrenal gland, unspecified: Secondary | ICD-10-CM

## 2021-11-20 DIAGNOSIS — E278 Other specified disorders of adrenal gland: Secondary | ICD-10-CM | POA: Diagnosis not present

## 2021-11-20 DIAGNOSIS — K529 Noninfective gastroenteritis and colitis, unspecified: Secondary | ICD-10-CM

## 2021-11-20 HISTORY — DX: Disorder of adrenal gland, unspecified: E27.9

## 2021-11-20 NOTE — Assessment & Plan Note (Signed)
Improved.  Continue Creon 2 caps with first bite of each meal and 1 cap with 1 bite of each snack. GI notes, colonoscopy and endoscopy reviewed.  Reviewed CT abdomen/pelvis from June 2023 with patient.

## 2021-11-20 NOTE — Assessment & Plan Note (Signed)
Right sided. Incidental finding on CT abdomen/pelvis.  Reviewed all findings from CT abdomen/pelvis from June 2023 with patient.  Will contact radiology for best procedure for follow up, either adrenal washout CT vs chemical shift MRI.  Will place orders accordingly.

## 2021-11-20 NOTE — Progress Notes (Signed)
Subjective:    Patient ID: Kayla Ritter, female    DOB: Aug 14, 1950, 71 y.o.   MRN: [id number]  HPI  Kayla Ritter is a very pleasant 71 y.o. female with a history of chronic diarrhea and hemorrhoids who presents today to discuss recent abdominal CT scan findings.  Evaluated by GI on 11/11/2021 for ongoing hemorrhoids, chronic diarrhea.  She underwent upper endoscopy and colonoscopy in May 2023, no evidence of celiac disease, H. pylori infection, IBD, microscopic colitis.  She did appear to have grade 1 symptomatic hemorrhoids.  She was initiated on a pancreatic enzyme replacement medication.   She underwent CT abdomen pelvis on 11/17/2021 which revealed an incidental finding of "indeterminate 2.1 cm right adrenal nodule, probably reflecting a benign adenoma."  Recommendation per radiology was for "adrenal washout CT or chemical shift MRI".  Her GI provider contacted [place] to handle these results.   Other findings on CT included moderate constipation, left-sided colonic diverticulosis without acute diverticulitis, aortic atherosclerosis.  She is managed on atorvastatin 40 mg daily.  Last lipid panel from November 2022 revealed LDL of 55, HDL 53.  She has been taking her Creon as prescribed which has helped with her diarrhea. She's undergone 1 hemorrhoid banding thus far, is scheduled for 2 additional banding procedures.   She denies unexplained weight loss, urinary symptoms.    Review of Systems  Constitutional:  Negative for unexpected weight change.  Gastrointestinal:  Positive for diarrhea. Negative for abdominal pain, anal bleeding and constipation.  Genitourinary:  Negative for frequency.         Past Medical History:  Diagnosis Date   Chickenpox    Genital warts    GERD (gastroesophageal reflux disease)    Hyperlipidemia    Urinary tract infection     Social History   Socioeconomic History   Marital status: Married    Spouse name: Not on file   Number of children: Not  on file   Years of education: Not on file   Highest education level: Not on file  Occupational History   Not on file  Tobacco Use   Smoking status: Former   Smokeless tobacco: Never  Vaping Use   Vaping Use: Never used  Substance and Sexual Activity   Alcohol use: Yes    Comment: 1 glass of wine per month   Drug use: Not Currently   Sexual activity: Not on file  Other Topics Concern   Not on file  Social History Narrative   ** Merged History Encounter **       Married. 4 children, 5 grandchildren.  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  Enjoys reading, crossword puzzles, spending time with grandchildren.    Social Determinants of Health   Financial Resource Strain: Low Risk  (04/22/2021)   Overall Financial Resource Strain (CARDIA)    Difficulty of Paying Living Expenses: Not hard at all  Food Insecurity: No Food Insecurity (04/22/2021)   Hunger Vital Sign    Worried About Running Out of Food in the Last Year: Never true    Ran Out of Food in the Last Year: Never true  Transportation Needs: No Transportation Needs (04/22/2021)   PRAPARE - Hydrologist (Medical): No    Lack of Transportation (Non-Medical): No  Physical Activity: Sufficiently Active (04/22/2021)   Exercise Vital Sign    Days of Exercise per Week: 7 days    Minutes of Exercise per Session: 30 min  Stress: No Stress Concern Present (04/22/2021)   [place]    Feeling of Stress : Not at all  Social Connections: [place] (04/22/2021)   Social Connection and Isolation Panel [NHANES]    Frequency of Communication with Friends and Family: More than three times a week    Frequency of Social Gatherings with Friends and Family: Twice a week    Attends Religious Services: More than 4 times per year    Active Member of [employer] or Organizations: Yes    Attends Archivist Meetings:  More than 4 times per year    Marital Status: Married  Human resources officer Violence: Not At Risk (04/22/2021)   Humiliation, Afraid, Rape, and Kick questionnaire    Fear of Current or Ex-Partner: No    Emotionally Abused: No    Physically Abused: No    Sexually Abused: No    Past Surgical History:  Procedure Laterality Date   CATARACT EXTRACTION Right 08/29/2019   CATARACT EXTRACTION Left 09/19/2019   COLONOSCOPY WITH ESOPHAGOGASTRODUODENOSCOPY (EGD)     COLONOSCOPY WITH PROPOFOL N/A 11/04/2021   Procedure: COLONOSCOPY WITH PROPOFOL;  Surgeon: Lin Landsman, MD;  Location: [facility];  Service: Gastroenterology;  Laterality: N/A;   ESOPHAGOGASTRODUODENOSCOPY (EGD) WITH PROPOFOL N/A 11/04/2021   Procedure: ESOPHAGOGASTRODUODENOSCOPY (EGD) WITH PROPOFOL;  Surgeon: Lin Landsman, MD;  Location: [facility] ENDOSCOPY;  Service: Gastroenterology;  Laterality: N/A;    Family History  Problem Relation Age of Onset   Arthritis Mother    Diabetes Mother    Hearing loss Mother    Hypertension Mother    Kidney cancer Mother    Hearing loss Father    Stomach cancer Father    Asthma Brother    Breast cancer Neg Hx     Allergies  Allergen Reactions   Codeine     REACTION: N' \\T'$ \ V    Current Outpatient Medications on File Prior to Visit  Medication Sig Dispense Refill   alendronate (FOSAMAX) 70 MG tablet Take 1 tablet by mouth once weekly on an empty stomach with water for bone density. Do not lay down within 1 hour after taking. 12 tablet 3   atorvastatin (LIPITOR) 40 MG tablet Take 1 tablet (40 mg total) by mouth daily. For cholesterol. 90 tablet 3   CALCIUM PO Take 1,000 mg by mouth daily.     Cholecalciferol (VITAMIN D PO) Take 1,000 Units by mouth daily.     lipase/protease/amylase (CREON) 36000 UNITS CPEP capsule Take 2 capsules with the first bite of each meal and 1 capsule with the first bite of each snack 240 capsule 5   omeprazole (PRILOSEC OTC) 20 MG tablet Take 20 mg by  mouth daily.     No current facility-administered medications on file prior to visit.    BP 134/76   Pulse 78   Temp 98.6 F (37 C) (Oral)   Ht '4\' 11"'$  (1.499 m)   Wt 120 lb (54.4 kg)   SpO2 98%   BMI 24.24 kg/m  Objective:   Physical Exam Cardiovascular:     Rate and Rhythm: Normal rate and regular rhythm.  Pulmonary:     Effort: Pulmonary effort is normal.     Breath sounds: Normal breath sounds.  Abdominal:     General: Bowel sounds are normal.     Palpations: Abdomen is soft.     Tenderness: There is no abdominal tenderness.  Musculoskeletal:     Cervical back:  Neck supple.  Skin:    General: Skin is warm and dry.           Assessment & Plan:   Problem List Items Addressed This Visit       Digestive   Chronic diarrhea    Improved.  Continue Creon 2 caps with first bite of each meal and 1 cap with 1 bite of each snack. GI notes, colonoscopy and endoscopy reviewed.  Reviewed CT abdomen/pelvis from June 2023 with patient.         Other   Adrenal nodule ([place]) - Primary    Right sided. Incidental finding on CT abdomen/pelvis.  Reviewed all findings from CT abdomen/pelvis from June 2023 with patient.  Will contact radiology for best procedure for follow up, either adrenal washout CT vs chemical shift MRI.  Will place orders accordingly.           Pleas Koch, NP

## 2021-11-20 NOTE — Patient Instructions (Signed)
You will be contacted regarding your CT or MRI.  Please let us know if you have not been contacted within two weeks.   It was a pleasure to see you today!

## 2021-11-25 ENCOUNTER — Ambulatory Visit (INDEPENDENT_AMBULATORY_CARE_PROVIDER_SITE_OTHER): Payer: PPO | Admitting: Nurse Practitioner

## 2021-11-25 ENCOUNTER — Encounter: Payer: Self-pay | Admitting: Gastroenterology

## 2021-11-25 ENCOUNTER — Ambulatory Visit: Payer: PPO | Admitting: Gastroenterology

## 2021-11-25 VITALS — BP 127/85 | HR 79 | Temp 99.5°F | Ht 59.0 in | Wt 119.0 lb

## 2021-11-25 VITALS — BP 130/74 | HR 71 | Temp 97.2°F | Ht 59.0 in | Wt 119.1 lb

## 2021-11-25 DIAGNOSIS — K8681 Exocrine pancreatic insufficiency: Secondary | ICD-10-CM | POA: Diagnosis not present

## 2021-11-25 DIAGNOSIS — R002 Palpitations: Secondary | ICD-10-CM | POA: Diagnosis not present

## 2021-11-25 DIAGNOSIS — M5412 Radiculopathy, cervical region: Secondary | ICD-10-CM

## 2021-11-25 DIAGNOSIS — K640 First degree hemorrhoids: Secondary | ICD-10-CM

## 2021-11-25 DIAGNOSIS — M542 Cervicalgia: Secondary | ICD-10-CM | POA: Insufficient documentation

## 2021-11-25 LAB — BASIC METABOLIC PANEL
BUN: 8 mg/dL (ref 6–23)
CO2: 32 mEq/L (ref 19–32)
Calcium: 9.7 mg/dL (ref 8.4–10.5)
Chloride: 103 mEq/L (ref 96–112)
Creatinine, Ser: 0.77 mg/dL (ref 0.40–1.20)
GFR: 77.83 mL/min (ref >60.00)
Glucose, Bld: 94 mg/dL (ref 70–99)
Potassium: 4.4 mEq/L (ref 3.5–5.1)
Sodium: 141 mEq/L (ref 135–145)

## 2021-11-25 LAB — CBC
HCT: 38.7 % (ref 36.0–46.0)
Hemoglobin: 13.1 g/dL (ref 12.0–15.0)
MCHC: 33.8 g/dL (ref 30.0–36.0)
MCV: 92 fl (ref 78.0–100.0)
Platelets: 272 10*3/uL (ref 150.0–400.0)
RBC: 4.2 Mil/uL (ref 3.87–5.11)
RDW: 13.3 % (ref 11.5–15.5)
WBC: 5.3 10*3/uL (ref 4.0–10.5)

## 2021-11-25 LAB — TSH: TSH: 2.52 u[IU]/mL (ref 0.35–5.50)

## 2021-11-25 MED ORDER — PREDNISONE 20 MG PO TABS: ORAL_TABLET | ORAL | 0 refills | Status: AC

## 2021-11-25 NOTE — Progress Notes (Signed)
PROCEDURE NOTE: The patient presents with symptomatic grade 1 hemorrhoids, unresponsive to maximal medical therapy, requesting rubber band ligation of his/her hemorrhoidal disease.  All risks, benefits and alternative forms of therapy were described and informed consent was obtained.  In the Left Lateral Decubitus position (if anoscopy is performed) anoscopic examination revealed grade 1 hemorrhoids in the all position(s).   The decision was made to band the RA internal hemorrhoid, and the [facility]'Regan System was used to perform band ligation without complication.  Digital anorectal examination was then performed to assure proper positioning of the band, and to adjust the banded tissue as required.  The patient was discharged home without pain or other issues.  Dietary and behavioral recommendations were given and (if necessary - prescriptions were given), along with follow-up instructions.  The patient will return 2 weeks for follow-up and possible additional banding as required.  No complications were encountered and the patient tolerated the procedure well.   

## 2021-11-25 NOTE — Assessment & Plan Note (Signed)
Stemming from previous neck injury.  Start prednisone 20 mg taper as prescribed.

## 2021-11-25 NOTE — Progress Notes (Signed)
Kayla Darby, MD [address]  [place], [place] [address]  Main: [phone]  Fax: [phone]    Gastroenterology Consultation  Referring Provider:     Pleas Koch, NP Primary Care Physician:  Pleas Koch, NP Primary Gastroenterologist:  Dr. Cephas Ritter Reason for Consultation:     Symptomatic hemorrhoids, chronic diarrhea secondary to EPI        HPI:   Kayla Ritter is a 71 y.o. female referred by Dr. Carlis Abbott, Leticia Penna, NP  for consultation & management of symptomatic hemorrhoids.  Patient reports that since August last year she has been experiencing bright red blood per rectum on wiping associated with perianal itching, burning, pressure, CPH.  Patient is started on hydrocortisone suppository which is helping with her symptoms significantly.  She also reports that for approximately 1 year patient has been experiencing nonbloody diarrhea, bowel movements anywhere from 4 to 5/day, may or may not be related to food, denies any nocturnal diarrhea, symptoms associated with urgency, denies any abdominal pain or bloating.  Patient denies any weight loss.  She does follow intermittent fasting.  Her labs including CBC, CMP, TSH are unremarkable.  Patient reports undergoing EGD and colonoscopy 7 years ago and colon polyps were removed Patient has not tried Imodium for her diarrhea  Patient lives with her husband and takes care of her father.  Denies any stress in her life or significant events that occurred in her life within last 1 year  Follow-up visit 11/11/2021 Patient is here for follow-up of symptomatic hemorrhoids.  She underwent work-up of chronic diarrhea, found to have severe EPI, pancreatic fecal elastase levels were less than 50.  She underwent EGD and colonoscopy which were unremarkable.  Therefore, I started patient on pancreatic enzyme replacement therapy.  She is interested to undergo hemorrhoid ligation due to persistent hemorrhoidal symptoms  as mentioned above Patient has started taking Creon 36 K 2 capsules with each meal and 1 with snack about a week ago only, she has gained about 2 pounds and she reports feeling 50% better, less number of watery stools and frequency is down to 5 to 6/day  Follow-up visit 11/25/2021 Patient is here for follow-up of EPI and hemorrhoid banding.  Patient reports continued improvement in her symptoms of diarrhea, both frequency and consistency.  She is not wearing pads anymore which she was doing it before due to fear of soiling.  She does report some gurgling after taking enzymes.  She also reports that her hemorrhoidal symptoms have significantly improved since first banding.  NSAIDs: None  Antiplts/Anticoagulants/Anti thrombotics: None  GI Procedures: EGD and colonoscopy 7 years ago, colon polyps were removed  EGD and colonoscopy 11/04/2021 - Normal duodenal bulb and second portion of the duodenum. Biopsied. - Normal stomach. Biopsied. - Normal gastroesophageal junction and esophagus. - Esophagogastric landmarks identified.  - Perianal skin tags found on perianal exam. - The examined portion of the ileum was normal. - One 5 mm polyp in the cecum, removed with a cold snare. Resected and retrieved. - Normal mucosa in the entire examined colon. Biopsied. - Diverticulosis in the recto-sigmoid colon and in the sigmoid colon. - Non-bleeding external and internal hemorrhoids.  DIAGNOSIS:  A. DUODENUM; COLD BIOPSY:  - ENTERIC MUCOSA WITH PRESERVED VILLOUS ARCHITECTURE AND NO SIGNIFICANT  HISTOPATHOLOGIC CHANGE.  - NEGATIVE FOR FEATURES OF CELIAC, DYSPLASIA, AND MALIGNANCY.   B. STOMACH; COLD BIOPSY:  - GASTRIC ANTRAL AND OXYNTIC MUCOSA WITH NO SIGNIFICANT  HISTOPATHOLOGIC  CHANGE.  - NEGATIVE FOR H. PYLORI, DYSPLASIA, AND MALIGNANCY.   C. COLON POLYP, CECUM; COLD SNARE:  - TUBULAR ADENOMA.  - NEGATIVE FOR HIGH-GRADE DYSPLASIA AND MALIGNANCY.   D. COLON, RANDOM; COLD BIOPSY:  - BENIGN  COLONIC MUCOSA WITH NO SIGNIFICANT HISTOPATHOLOGIC CHANGE.  - NEGATIVE FOR ACTIVE MUCOSAL COLITIS AND FEATURES OF MICROSCOPIC  COLITIS.  - NEGATIVE FOR DYSPLASIA AND MALIGNANCY.    Past Medical History:  Diagnosis Date   Chickenpox    Genital warts    GERD (gastroesophageal reflux disease)    Hyperlipidemia    Urinary tract infection     Past Surgical History:  Procedure Laterality Date   CATARACT EXTRACTION Right 08/29/2019   CATARACT EXTRACTION Left 09/19/2019   COLONOSCOPY WITH ESOPHAGOGASTRODUODENOSCOPY (EGD)     COLONOSCOPY WITH PROPOFOL N/A 11/04/2021   Procedure: COLONOSCOPY WITH PROPOFOL;  Surgeon: Lin Landsman, MD;  Location: [facility];  Service: Gastroenterology;  Laterality: N/A;   ESOPHAGOGASTRODUODENOSCOPY (EGD) WITH PROPOFOL N/A 11/04/2021   Procedure: ESOPHAGOGASTRODUODENOSCOPY (EGD) WITH PROPOFOL;  Surgeon: Lin Landsman, MD;  Location: [facility] ENDOSCOPY;  Service: Gastroenterology;  Laterality: N/A;   Current Outpatient Medications:    alendronate (FOSAMAX) 70 MG tablet, Take 1 tablet by mouth once weekly on an empty stomach with water for bone density. Do not lay down within 1 hour after taking., Disp: 12 tablet, Rfl: 3   atorvastatin (LIPITOR) 40 MG tablet, Take 1 tablet (40 mg total) by mouth daily. For cholesterol., Disp: 90 tablet, Rfl: 3   CALCIUM PO, Take 1,000 mg by mouth daily., Disp: , Rfl:    Cholecalciferol (VITAMIN D PO), Take 1,000 Units by mouth daily., Disp: , Rfl:    lipase/protease/amylase (CREON) 36000 UNITS CPEP capsule, Take 2 capsules with the first bite of each meal and 1 capsule with the first bite of each snack, Disp: 240 capsule, Rfl: 5   omeprazole (PRILOSEC OTC) 20 MG tablet, Take 20 mg by mouth daily., Disp: , Rfl:    predniSONE (DELTASONE) 20 MG tablet, Take 1 tablet (20 mg total) by mouth 2 (two) times daily with a meal for 4 days, THEN 1 tablet (20 mg total) daily with breakfast for 4 days. Avoid NSAIDs while on medication  such as: ibuprofen, aleve, naproxen, BC/Goody powders., Disp: 12 tablet, Rfl: 0    Family History  Problem Relation Age of Onset   Arthritis Mother    Diabetes Mother    Hearing loss Mother    Hypertension Mother    Kidney cancer Mother    Hearing loss Father    Stomach cancer Father    Asthma Brother    Breast cancer Neg Hx      Social History   Tobacco Use   Smoking status: Former   Smokeless tobacco: Never  Vaping Use   Vaping Use: Never used  Substance Use Topics   Alcohol use: Yes    Comment: 1 glass of wine per month   Drug use: Not Currently    Allergies as of 11/25/2021 - Review Complete 11/25/2021  Allergen Reaction Noted   Codeine      Review of Systems:    All systems reviewed and negative except where noted in HPI.   Physical Exam:  BP 127/85 (BP Location: Right Arm, Patient Position: Sitting, Cuff Size: Normal)   Pulse 79   Temp 99.5 F (37.5 C) (Oral)   Ht '4\' 11"'$  (1.499 m)   Wt 119 lb (54 kg)   BMI 24.04 kg/m  No LMP recorded. Patient is postmenopausal.  General:   Alert,  Well-developed, well-nourished, pleasant and cooperative in NAD Head:  Normocephalic and atraumatic. Eyes:  Sclera clear, no icterus.   Conjunctiva pink. Ears:  Normal auditory acuity. Nose:  No deformity, discharge, or lesions. Mouth:  No deformity or lesions,oropharynx pink & moist. Neck:  Supple; no masses or thyromegaly. Lungs:  Respirations even and unlabored.  Clear throughout to auscultation.   No wheezes, crackles, or rhonchi. No acute distress. Heart:  Regular rate and rhythm; no murmurs, clicks, rubs, or gallops. Abdomen:  Normal bowel sounds. Soft, non-tender and non-distended without masses, hepatosplenomegaly or hernias noted.  No guarding or rebound tenderness.   Rectal: Not performed Msk:  Symmetrical without gross deformities. Good, equal movement & strength bilaterally. Pulses:  Normal pulses noted. Extremities:  No clubbing or edema.  No  cyanosis. Neurologic:  Alert and oriented x3;  grossly normal neurologically. Skin:  Intact without significant lesions or rashes. No jaundice. Psych:  Alert and cooperative. Normal mood and affect.  Imaging Studies: No abdominal imaging  Assessment and Plan:   Kayla Ritter is a 71 y.o. pleasant Caucasian female with history of osteopenia is seen in consultation for chronic diarrhea and bright red blood per rectum on wiping associated with rectal discomfort, perianal itching  Chronic diarrhea secondary to severe EPI: Significantly improved GI profile PCR for negative for infection Pancreatic fecal elastase levels was less than 50 EGD and colonoscopy including biopsies were unremarkable, no evidence of celiac disease, H. pylori infection, IBD, microscopic colitis Continue creon 36 or 72 K with each meal and 36 K with snack, pancreatic enzymes might be contributing to some of her GI symptoms like gurgling in her stomach.  Therefore, advised her to decrease the dose and see if it helps CT abdomen and pelvis with contrast revealed normal pancreas  Grade 1 symptomatic hemorrhoids Patient is interested in hemorrhoid ligation.  Risks and benefits, procedure details discussed. Consent obtained Proceed with hemorrhoid ligation today   Follow up in 2 weeks  Kayla Darby, MD

## 2021-11-25 NOTE — Assessment & Plan Note (Signed)
Felt briefly after getting home from truck and changing close.  No history of the same.  EKG within normal limits in office today.  Check electrolytes along with TSH.  If all normal will offer patient referral to cardiology for heart monitor placement.  Patient acknowledged plan of care

## 2021-11-25 NOTE — Patient Instructions (Signed)
Nice to see you today I will be in touch with the labs once I have them EKG looked ok in office today I sent in some prednisone, avoid NSAIDs while on this medication Follow up if no improvement

## 2021-11-25 NOTE — Progress Notes (Signed)
Acute Office Visit  Subjective:     Patient ID: Kayla Ritter, female    DOB: May 31, 1951, 71 y.o.   MRN: [id number]  Chief Complaint  Patient presents with   Neck Pain        Tachycardia    Happened on Sunday-Just a one time episode when changing clothes when she got home from church     Patient is in today for Neck pain  Started about a week ago. States that it starts doing the things she realizes. She had fallen approx 1 year. States that it started back and was treated with prednisone. States that she has some stiffness. States that she describes it a dull ache. And numbness and tingling is intermittent. States that hse has tried ibuprofen  Racing heart rate: States that she came home form church Sunday and changed her clothes. States that her heart starting racing. States that she has a pulse ox and it was 145. States that she laid down and tried to control her breathing. Lasted for approx 1 min.   Review of Systems  Constitutional:  Negative for chills and fever.  Respiratory:  Negative for shortness of breath.   Cardiovascular:  Positive for palpitations. Negative for chest pain.  Musculoskeletal:  Positive for neck pain.  Neurological:  Positive for tingling.        Objective:    BP 130/74   Pulse 71   Temp (!) 97.2 F (36.2 C) (Temporal)   Ht '4\' 11"'$  (1.499 m)   Wt 119 lb 2 oz (54 kg)   SpO2 96%   BMI 24.06 kg/m    Physical Exam Vitals and nursing note reviewed.  Constitutional:      Appearance: Normal appearance.  Cardiovascular:     Rate and Rhythm: Normal rate and regular rhythm.     Heart sounds: Normal heart sounds.  Pulmonary:     Effort: Pulmonary effort is normal.     Breath sounds: Normal breath sounds.  Musculoskeletal:        General: Tenderness present. No signs of injury.     Cervical back: Tenderness present. No bony tenderness or crepitus. No pain with movement.       Back:     Comments: Muscle tightness to the right Trap   Neurological:     Mental Status: She is alert.     Deep Tendon Reflexes:     Reflex Scores:      Bicep reflexes are 2+ on the right side and 2+ on the left side.    Comments: Bilateral upper extremity strength 5/5     No results found for any visits on 11/25/21.      Assessment & Plan:   Problem List Items Addressed This Visit       Nervous and Auditory   Cervical radiculopathy, acute    Stemming from previous neck injury.  Start prednisone 20 mg taper as prescribed.      Relevant Medications   predniSONE (DELTASONE) 20 MG tablet     Other   Palpitation - Primary    Felt briefly after getting home from truck and changing close.  No history of the same.  EKG within normal limits in office today.  Check electrolytes along with TSH.  If all normal will offer patient referral to cardiology for heart monitor placement.  Patient acknowledged plan of care      Relevant Orders   EKG 12-Lead (Completed)   CBC   Basic metabolic panel  TSH   Neck pain    Has happened in the past resulting from a fall approximate 1 year ago.  Patient has used prednisone in the past with good relief.  Patient states she is getting ready to go out of town and would like to work on getting the inflammation down.  Will write patient prednisone 20 mg taper prescription.  Avoid NSAIDs while in use.  Patient may benefit from muscle relaxer.  States she does have some at home and will MyChart me the name and milligrams of the medication.  If she does not have adequate relief we can consider using muscle relaxer if appropriate or may sending in a new prescription.      Relevant Medications   predniSONE (DELTASONE) 20 MG tablet    Meds ordered this encounter  Medications   predniSONE (DELTASONE) 20 MG tablet    Sig: Take 1 tablet (20 mg total) by mouth 2 (two) times daily with a meal for 4 days, THEN 1 tablet (20 mg total) daily with breakfast for 4 days. Avoid NSAIDs while on medication such as:  ibuprofen, aleve, naproxen, BC/Goody powders.    Dispense:  12 tablet    Refill:  0    Order Specific Question:   Supervising Provider    Answer:   TOWER, MARNE A [[id number]    Return if symptoms worsen or fail to improve.  Romilda Garret, NP

## 2021-11-25 NOTE — Assessment & Plan Note (Signed)
Has happened in the past resulting from a fall approximate 1 year ago.  Patient has used prednisone in the past with good relief.  Patient states she is getting ready to go out of town and would like to work on getting the inflammation down.  Will write patient prednisone 20 mg taper prescription.  Avoid NSAIDs while in use.  Patient may benefit from muscle relaxer.  States she does have some at home and will MyChart me the name and milligrams of the medication.  If she does not have adequate relief we can consider using muscle relaxer if appropriate or may sending in a new prescription.

## 2021-11-26 ENCOUNTER — Encounter: Payer: Self-pay | Admitting: Nurse Practitioner

## 2021-12-07 DIAGNOSIS — R002 Palpitations: Secondary | ICD-10-CM

## 2021-12-08 ENCOUNTER — Emergency Department: Payer: PPO

## 2021-12-08 ENCOUNTER — Emergency Department
Admission: EM | Admit: 2021-12-08 | Discharge: 2021-12-08 | Disposition: A | Discharge status: Home / Self Care | Payer: PPO | Attending: Emergency Medicine | Admitting: Emergency Medicine

## 2021-12-08 ENCOUNTER — Other Ambulatory Visit: Payer: Self-pay

## 2021-12-08 ENCOUNTER — Encounter: Payer: Self-pay | Admitting: Emergency Medicine

## 2021-12-08 DIAGNOSIS — R002 Palpitations: Secondary | ICD-10-CM | POA: Insufficient documentation

## 2021-12-08 DIAGNOSIS — M542 Cervicalgia: Secondary | ICD-10-CM | POA: Diagnosis not present

## 2021-12-08 DIAGNOSIS — M25512 Pain in left shoulder: Secondary | ICD-10-CM | POA: Insufficient documentation

## 2021-12-08 DIAGNOSIS — R42 Dizziness and giddiness: Secondary | ICD-10-CM | POA: Insufficient documentation

## 2021-12-08 LAB — CBC
HCT: 41.7 % (ref 36.0–46.0)
Hemoglobin: 13.6 g/dL (ref 12.0–15.0)
MCH: 30.6 pg (ref 26.0–34.0)
MCHC: 32.6 g/dL (ref 30.0–36.0)
MCV: 93.7 fL (ref 80.0–100.0)
Platelets: 246 10*3/uL (ref 150–400)
RBC: 4.45 MIL/uL (ref 3.87–5.11)
RDW: 12.7 % (ref 11.5–15.5)
WBC: 7.5 10*3/uL (ref 4.0–10.5)
nRBC: 0 % (ref 0.0–0.2)

## 2021-12-08 LAB — BASIC METABOLIC PANEL
Anion gap: 8 (ref 5–15)
BUN: 10 mg/dL (ref 8–23)
CO2: 27 mmol/L (ref 22–32)
Calcium: 9.4 mg/dL (ref 8.9–10.3)
Chloride: 105 mmol/L (ref 98–111)
Creatinine, Ser: 0.78 mg/dL (ref 0.44–1.00)
GFR, Estimated: >60 mL/min (ref >60)
Glucose, Bld: 108 mg/dL — ABNORMAL HIGH (ref 70–99)
Potassium: 3.7 mmol/L (ref 3.5–5.1)
Sodium: 140 mmol/L (ref 135–145)

## 2021-12-08 LAB — T4, FREE: Free T4: 1.13 ng/dL — ABNORMAL HIGH (ref 0.61–1.12)

## 2021-12-08 LAB — TSH: TSH: 0.841 u[IU]/mL (ref 0.350–4.500)

## 2021-12-08 LAB — TROPONIN I (HIGH SENSITIVITY)
Troponin I (High Sensitivity): 4 ng/L (ref <18)
Troponin I (High Sensitivity): 4 ng/L (ref <18)

## 2021-12-08 LAB — MAGNESIUM: Magnesium: 2.2 mg/dL (ref 1.7–2.4)

## 2021-12-08 NOTE — ED Notes (Signed)
Pt A&O, pt given discharge instructions, pt ambulating with steady gait. 

## 2021-12-08 NOTE — ED Provider Notes (Signed)
[facility] Provider Note    Event Date/Time   First MD Initiated Contact with Patient 12/08/21 1643     (approximate)   History   Chest Pain and Palpitations   HPI  Kayla Ritter is a 71 y.o. female who presents to the emergency department today because of concerns for dizziness.  The patient states that she experienced dizziness and lightheadedness this morning.  At the time my exam her symptoms had subsided.  She was concerned about her heart because she states over the past couple weeks she has had a few episodes where she felt like her heart was racing.  She did not have the sensation today with the dizziness however.  Additionally she has had intermittent episodes of left shoulder and neck pain. Has discussed with her primary care these episodes of palpitations and has cardiology appointment scheduled for August.      Physical Exam   Triage Vital Signs: ED Triage Vitals  Enc Vitals Group     BP 12/08/21 1545 (!) 146/85     Pulse Rate 12/08/21 1545 74     Resp 12/08/21 1545 16     Temp 12/08/21 1545 98.3 F (36.8 C)     Temp Source 12/08/21 1545 Oral     SpO2 12/08/21 1545 97 %     Weight 12/08/21 1538 116 lb (52.6 kg)     Height 12/08/21 1538 4\' 11"  (1.499 m)     Head Circumference --      Peak Flow --      Pain Score 12/08/21 1538 4     Pain Loc --      Pain Edu? --      Excl. in GC? --     Most recent vital signs: Vitals:   12/08/21 1545  BP: (!) 146/85  Pulse: 74  Resp: 16  Temp: 98.3 F (36.8 C)  SpO2: 97%    General: Awake, alert, oriented. CV:  Good peripheral perfusion. Regular rate and rhythm. No m/r/g. Resp:  Normal effort. Lungs clear. Abd:  No distention. Non tender.    ED Results / Procedures / Treatments   Labs (all labs ordered are listed, but only abnormal results are displayed) Labs Reviewed  BASIC METABOLIC PANEL - Abnormal; Notable for the following components:      Result Value   Glucose, Bld 108  (*)    All other components within normal limits  T4, FREE - Abnormal; Notable for the following components:   Free T4 1.13 (*)    All other components within normal limits  CBC  TSH  MAGNESIUM  TROPONIN I (HIGH SENSITIVITY)  TROPONIN I (HIGH SENSITIVITY)     EKG  I, Phineas Semen, attending physician, personally viewed and interpreted this EKG  EKG Time: 1539 Rate: 75 Rhythm: normal sinus rhythm Axis: normal Intervals: qtc 406 QRS: narrow ST changes: no st elevation Impression: normal ekg  RADIOLOGY I independently interpreted and visualized the CXR. My interpretation: No pneumonia. No pneumothorax.  Radiology interpretation:  IMPRESSION:  Stable chest.  No evidence of active cardiopulmonary process.    PROCEDURES:  Critical Care performed: No  Procedures   MEDICATIONS ORDERED IN ED: Medications - No data to display   IMPRESSION / MDM / ASSESSMENT AND PLAN / ED COURSE  I reviewed the triage vital signs and the nursing notes.  Differential diagnosis includes, but is not limited to, anemia, electrolyte abnormality, arrhythmia, ACS.  Patient's presentation is most consistent with acute presentation with potential threat to life or bodily function.  Patient presented to the emergency department today because of concerns for episode of dizziness that occurred today.  Patient has recently had a couple episodes of palpitations as well.  Work-up here without concerning anemia or electrolyte abnormality.  Troponin was negative x2.  EKG without concerning arrhythmia.  Patient did feel better during her time here in the emergency department with no reported further episodes of dizziness.  Patient does have appointment to follow-up with cardiology which I think is reasonable.  FINAL CLINICAL IMPRESSION(S) / ED DIAGNOSES   Final diagnoses:  Dizziness    Note:  This document was prepared using Dragon voice recognition software and may  include unintentional dictation errors.    Phineas Semen, MD 12/08/21 [phone]

## 2021-12-11 ENCOUNTER — Ambulatory Visit: Payer: PPO | Admitting: Gastroenterology

## 2021-12-11 ENCOUNTER — Encounter: Payer: Self-pay | Admitting: Gastroenterology

## 2021-12-11 VITALS — BP 108/72 | HR 72 | Temp 98.6°F | Ht 59.0 in | Wt 116.2 lb

## 2021-12-11 DIAGNOSIS — K625 Hemorrhage of anus and rectum: Secondary | ICD-10-CM | POA: Diagnosis not present

## 2021-12-11 DIAGNOSIS — K640 First degree hemorrhoids: Secondary | ICD-10-CM

## 2021-12-11 NOTE — Progress Notes (Signed)
PROCEDURE NOTE: The patient presents with symptomatic grade 1 hemorrhoids, unresponsive to maximal medical therapy, requesting rubber band ligation of his/her hemorrhoidal disease.  All risks, benefits and alternative forms of therapy were described and informed consent was obtained.  The decision was made to band the LL internal hemorrhoid, and the [facility] System was used to perform band ligation without complication.  Digital anorectal examination was then performed to assure proper positioning of the band, and to adjust the banded tissue as required.  The patient was discharged home without pain or other issues.  Dietary and behavioral recommendations were given and (if necessary - prescriptions were given), along with follow-up instructions.  The patient will return 2 weeks for follow-up and possible additional banding as required.  No complications were encountered and the patient tolerated the procedure well.    

## 2021-12-12 ENCOUNTER — Ambulatory Visit: Payer: PPO | Admitting: Cardiology

## 2021-12-12 ENCOUNTER — Ambulatory Visit (INDEPENDENT_AMBULATORY_CARE_PROVIDER_SITE_OTHER): Payer: PPO

## 2021-12-12 ENCOUNTER — Encounter: Payer: Self-pay | Admitting: Cardiology

## 2021-12-12 VITALS — BP 142/85 | HR 71 | Ht 59.0 in | Wt 116.1 lb

## 2021-12-12 DIAGNOSIS — R002 Palpitations: Secondary | ICD-10-CM

## 2021-12-12 DIAGNOSIS — E7800 Pure hypercholesterolemia, unspecified: Secondary | ICD-10-CM

## 2021-12-12 DIAGNOSIS — R030 Elevated blood-pressure reading, without diagnosis of hypertension: Secondary | ICD-10-CM | POA: Diagnosis not present

## 2021-12-12 NOTE — Progress Notes (Signed)
Cardiology Office Note:    Date:  12/12/2021   ID:  Kayla Ritter, DOB July 21, 1950, MRN [id number]  PCP:  Pleas Koch, NP   [place] Providers Cardiologist:  None     Referring MD: Pleas Koch, NP   Chief Complaint  Patient presents with   Other    Palpitations/dizziness. Meds reviewed verbally with pt.   Kayla Ritter is a 71 y.o. female who is being seen today for the evaluation of palpitations at the request of Pleas Koch, NP.   History of Present Illness:    Kayla Ritter is a 71 y.o. female with a hx of hyperlipidemia, GERD who presents due to palpitations.  Symptoms of palpitations began 3 weeks ago while patient was laying at home.  Symptoms lasted about 2 to 3 minutes.  She called her PCP, had some blood work done, EKG was performed and was told everything was okay.  She went on vacation last week, had 2 further episodes of palpitation lasting about 3 minutes.  Denies dizziness, chest pain.  Denies syncope.  Has pancreatic insufficiency, takes Creon which she started 4 weeks ago.  Past Medical History:  Diagnosis Date   Chickenpox    Genital warts    GERD (gastroesophageal reflux disease)    Hyperlipidemia    Urinary tract infection     Past Surgical History:  Procedure Laterality Date   CATARACT EXTRACTION Right 08/29/2019   CATARACT EXTRACTION Left 09/19/2019   COLONOSCOPY WITH ESOPHAGOGASTRODUODENOSCOPY (EGD)     COLONOSCOPY WITH PROPOFOL N/A 11/04/2021   Procedure: COLONOSCOPY WITH PROPOFOL;  Surgeon: Lin Landsman, MD;  Location: [place];  Service: Gastroenterology;  Laterality: N/A;   ESOPHAGOGASTRODUODENOSCOPY (EGD) WITH PROPOFOL N/A 11/04/2021   Procedure: ESOPHAGOGASTRODUODENOSCOPY (EGD) WITH PROPOFOL;  Surgeon: Lin Landsman, MD;  Location: [facility] ENDOSCOPY;  Service: Gastroenterology;  Laterality: N/A;    Current Medications: Current Meds  Medication Sig   alendronate (FOSAMAX) 70 MG tablet Take 1  tablet by mouth once weekly on an empty stomach with water for bone density. Do not lay down within 1 hour after taking.   atorvastatin (LIPITOR) 40 MG tablet Take 1 tablet (40 mg total) by mouth daily. For cholesterol.   CALCIUM PO Take 1,000 mg by mouth daily.   Cholecalciferol (VITAMIN D PO) Take 1,000 Units by mouth daily.   lipase/protease/amylase (CREON) 36000 UNITS CPEP capsule Take 2 capsules with the first bite of each meal and 1 capsule with the first bite of each snack   MAGNESIUM PO Take by mouth daily.   omeprazole (PRILOSEC OTC) 20 MG tablet Take 20 mg by mouth daily.     Allergies:   Codeine   Social History   Socioeconomic History   Marital status: Married    Spouse name: Not on file   Number of children: Not on file   Years of education: Not on file   Highest education level: Not on file  Occupational History   Not on file  Tobacco Use   Smoking status: Former   Smokeless tobacco: Never  Vaping Use   Vaping Use: Never used  Substance and Sexual Activity   Alcohol use: Yes    Comment: 1 glass of wine per month   Drug use: Not Currently   Sexual activity: Not on file  Other Topics Concern   Not on file  Social History Narrative   ** Merged History Encounter **       Married. 4  children, 5 grandchildren.  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  Enjoys reading, crossword puzzles, spending time with grandchildren.    Social Determinants of Health   Financial Resource Strain: Low Risk  (04/22/2021)   Overall Financial Resource Strain (CARDIA)    Difficulty of Paying Living Expenses: Not hard at all  Food Insecurity: No Food Insecurity (04/22/2021)   Hunger Vital Sign    Worried About Running Out of Food in the Last Year: Never true    Ran Out of Food in the Last Year: Never true  Transportation Needs: No Transportation Needs (04/22/2021)   PRAPARE - Hydrologist (Medical): No    Lack of Transportation  (Non-Medical): No  Physical Activity: Sufficiently Active (04/22/2021)   Exercise Vital Sign    Days of Exercise per Week: 7 days    Minutes of Exercise per Session: 30 min  Stress: No Stress Concern Present (04/22/2021)   [place]    Feeling of Stress : Not at all  Social Connections: [place] (04/22/2021)   Social Connection and Isolation Panel [NHANES]    Frequency of Communication with Friends and Family: More than three times a week    Frequency of Social Gatherings with Friends and Family: Twice a week    Attends Religious Services: More than 4 times per year    Active Member of [employer] or Organizations: Yes    Attends Music therapist: More than 4 times per year    Marital Status: Married     Family History: The patient's family history includes Arthritis in her mother; Asthma in her brother; Diabetes in her mother; Hearing loss in her father and mother; Heart Problems in her brother; Hypertension in her mother; Kidney cancer in her mother; Stomach cancer in her father. There is no history of Breast cancer.  ROS:   Please see the history of present illness.     All other systems reviewed and are negative.  EKGs/Labs/Other Studies Reviewed:    The following studies were reviewed today:   EKG:  EKG is  ordered today.  The ekg ordered today demonstrates normal sinus rhythm  Recent Labs: 05/13/2021: ALT 14 12/08/2021: BUN 10; Creatinine, Ser 0.78; Hemoglobin 13.6; Magnesium 2.2; Platelets 246; Potassium 3.7; Sodium 140; TSH 0.841  Recent Lipid Panel    Component Value Date/Time   CHOL 137 05/13/2021 1211   TRIG 144.0 05/13/2021 1211   HDL 53.00 05/13/2021 1211   CHOLHDL 3 05/13/2021 1211   VLDL 28.8 05/13/2021 1211   LDLCALC 55 05/13/2021 1211   LDLDIRECT 162.0 03/14/2018 0849     Risk Assessment/Calculations:          Physical Exam:    VS:  BP (!) 142/85 (BP Location: Right  Arm, Patient Position: Sitting, Cuff Size: Normal)   Pulse 71   Ht '4\' 11"'$  (1.499 m)   Wt 116 lb 2 oz (52.7 kg)   SpO2 98%   BMI 23.45 kg/m     Wt Readings from Last 3 Encounters:  12/12/21 116 lb 2 oz (52.7 kg)  12/11/21 116 lb 4 oz (52.7 kg)  12/08/21 116 lb (52.6 kg)     GEN:  Well nourished, well developed in no acute distress HEENT: Normal NECK: No JVD; No carotid bruits CARDIAC: RRR, no murmurs, rubs, gallops RESPIRATORY:  Clear to auscultation without rales, wheezing or rhonchi  ABDOMEN: Soft, non-tender, non-distended MUSCULOSKELETAL:  No  edema; No deformity  SKIN: Warm and dry NEUROLOGIC:  Alert and oriented x 3 PSYCHIATRIC:  Normal affect   ASSESSMENT:    1. Palpitations   2. Pure hypercholesterolemia   3. Elevated BP without diagnosis of hypertension    PLAN:    In order of problems listed above:  Palpitations beginning 3 weeks ago.  Started taking Creon 4 weeks ago.  There are some reports of Creon causing palpitations.  Unsure if this is contributing.  Place cardiac monitor to evaluate any significant arrhythmias.  Orthostatic vitals today did not show any evidence for orthostasis. Hyperlipidemia, cholesterol controlled, continue Lipitor. Elevated BP, BP well controlled.  Continue to monitor.  Follow-up after cardiac monitor.     Medication Adjustments/Labs and Tests Ordered: Current medicines are reviewed at length with the patient today.  Concerns regarding medicines are outlined above.  Orders Placed This Encounter  Procedures   LONG TERM MONITOR (3-14 DAYS)   EKG 12-Lead   No orders of the defined types were placed in this encounter.   Patient Instructions  Medication Instructions:  Your physician recommends that you continue on your current medications as directed. Please refer to the Current Medication list given to you today.  *If you need a refill on your cardiac medications before your next appointment, please call your  pharmacy*   Testing/Procedures:  Your physician has recommended that you wear a Zio XT monitor for 2 weeks. This will be mailed to your home address in 4-5 business days.   Your clinician has requested a Zio heart rhythm monitor by [employer] to be mailed to your home for you to wear for 14 days. You should expect a small box to arrive via USPS (or FedEx in some cases) within this next week. If you do not receive it please call [employer] at [phone].  Closely watching your heart at this time will help your care team understand more and provide information needed to develop your plan of care.  Please apply your Zio patch monitor the day you receive it. Keep this packaging, you will use this to return your Zio monitor.  You will easily be able to apply the monitor with the instructions provided in the Patient Guide.  If you need assistance, [employer] representatives are available 24/7 at [phone].  You can also download the [facility] app on your phone to view detailed application instructions and log symptoms.  After you wear your monitor for 14 days, place it back in the blue box or envelope, along with your Symptom Log.  To send your monitor back: Simply use the pre-addressed and pre-paid box/envelope.  Send it back through [employer] the same day you remove it via your local post office or by placing it in your mailbox.  As soon as we receive the results, they will be reviewed and your clinician will contact you.  For the first 24 hours- it is essential to not shower or exercise, to allow the patch to adhere to your skin. Avoid excessive sweating to help maximize wear time. Do not submerge the device, no hot tubs, and no swimming pools. Keep any lotions or oils away from the patch. After 24 hours you may shower with the patch on. Take brief showers with your back facing the shower head.  Do not remove patch once it has been placed because that will interrupt data and decrease  adhesive wear time. Push the button when you have any symptoms and write down what you were feeling. Once  you have completed wearing your monitor, remove and place into box which has postage paid and place in your outgoing mailbox.  If for some reason you have misplaced your box then call our office and we can provide another box and/or mail it off for you.    Follow-Up: At [facility], you and your health needs are our priority.  As part of our continuing mission to provide you with exceptional heart care, we have created designated Provider Care Teams.  These Care Teams include your primary Cardiologist (physician) and Advanced Practice Providers (APPs -  Physician Assistants and Nurse Practitioners) who all work together to provide you with the care you need, when you need it.  We recommend signing up for the patient portal called "MyChart".  Sign up information is provided on this After Visit Summary.  MyChart is used to connect with patients for Virtual Visits (Telemedicine).  Patients are able to view lab/test results, encounter notes, upcoming appointments, etc.  Non-urgent messages can be sent to your provider as well.   To learn more about what you can do with MyChart, go to [email or web address].    Your next appointment:   6-8 week(s)  The format for your next appointment:   In Person  Provider:   You may see Kate Sable, MD or one of the following Advanced Practice Providers on your designated Care Team:   Murray Hodgkins, NP Christell Faith, PA-C Cadence Kathlen Mody, [place]    Other Instructions   Important Information About Sugar         Signed, Kate Sable, MD  12/12/2021 5:11 PM    [place]

## 2021-12-12 NOTE — Patient Instructions (Signed)
Medication Instructions:  Your physician recommends that you continue on your current medications as directed. Please refer to the Current Medication list given to you today.  *If you need a refill on your cardiac medications before your next appointment, please call your pharmacy*   Testing/Procedures:  Your physician has recommended that you wear a Zio XT monitor for 2 weeks. This will be mailed to your home address in 4-5 business days.   Your clinician has requested a Zio heart rhythm monitor by [employer] to be mailed to your home for you to wear for 14 days. You should expect a small box to arrive via USPS (or FedEx in some cases) within this next week. If you do not receive it please call [employer] at [phone].  Closely watching your heart at this time will help your care team understand more and provide information needed to develop your plan of care.  Please apply your Zio patch monitor the day you receive it. Keep this packaging, you will use this to return your Zio monitor.  You will easily be able to apply the monitor with the instructions provided in the Patient Guide.  If you need assistance, [employer] representatives are available 24/7 at [phone].  You can also download the [facility] app on your phone to view detailed application instructions and log symptoms.  After you wear your monitor for 14 days, place it back in the blue box or envelope, along with your Symptom Log.  To send your monitor back: Simply use the pre-addressed and pre-paid box/envelope.  Send it back through [employer] the same day you remove it via your local post office or by placing it in your mailbox.  As soon as we receive the results, they will be reviewed and your clinician will contact you.  For the first 24 hours- it is essential to not shower or exercise, to allow the patch to adhere to your skin. Avoid excessive sweating to help maximize wear time. Do not submerge the device, no hot tubs,  and no swimming pools. Keep any lotions or oils away from the patch. After 24 hours you may shower with the patch on. Take brief showers with your back facing the shower head.  Do not remove patch once it has been placed because that will interrupt data and decrease adhesive wear time. Push the button when you have any symptoms and write down what you were feeling. Once you have completed wearing your monitor, remove and place into box which has postage paid and place in your outgoing mailbox.  If for some reason you have misplaced your box then call our office and we can provide another box and/or mail it off for you.    Follow-Up: At [facility], you and your health needs are our priority.  As part of our continuing mission to provide you with exceptional heart care, we have created designated Provider Care Teams.  These Care Teams include your primary Cardiologist (physician) and Advanced Practice Providers (APPs -  Physician Assistants and Nurse Practitioners) who all work together to provide you with the care you need, when you need it.  We recommend signing up for the patient portal called "MyChart".  Sign up information is provided on this After Visit Summary.  MyChart is used to connect with patients for Virtual Visits (Telemedicine).  Patients are able to view lab/test results, encounter notes, upcoming appointments, etc.  Non-urgent messages can be sent to your provider as well.   To learn more about  what you can do with MyChart, go to [email or web address].    Your next appointment:   6-8 week(s)  The format for your next appointment:   In Person  Provider:   You may see Kate Sable, MD or one of the following Advanced Practice Providers on your designated Care Team:   Murray Hodgkins, NP Christell Faith, PA-C Cadence Kathlen Mody, [place]    Other Instructions   Important Information About Sugar

## 2021-12-15 DIAGNOSIS — R002 Palpitations: Secondary | ICD-10-CM

## 2022-01-15 ENCOUNTER — Ambulatory Visit: Payer: PPO | Admitting: Gastroenterology

## 2022-01-15 ENCOUNTER — Encounter: Payer: Self-pay | Admitting: Gastroenterology

## 2022-01-15 VITALS — BP 134/76 | HR 71 | Temp 97.8°F | Ht 59.0 in | Wt 116.5 lb

## 2022-01-15 DIAGNOSIS — K8681 Exocrine pancreatic insufficiency: Secondary | ICD-10-CM | POA: Diagnosis not present

## 2022-01-15 NOTE — Progress Notes (Signed)
Cephas Darby, MD [address]  [place]  [place], [place] [address]  Main: [phone]  Fax: [phone]    Gastroenterology Consultation  Referring Provider:     Pleas Koch, NP Primary Care Physician:  Pleas Koch, NP Primary Gastroenterologist:  Dr. Cephas Darby Reason for Consultation:     Symptomatic hemorrhoids, chronic diarrhea secondary to EPI        HPI:   Kayla Ritter is a 71 y.o. female referred by Dr. Carlis Abbott, Leticia Penna, NP  for consultation & management of symptomatic hemorrhoids.  Patient reports that since August last year she has been experiencing bright red blood per rectum on wiping associated with perianal itching, burning, pressure, CPH.  Patient is started on hydrocortisone suppository which is helping with her symptoms significantly.  She also reports that for approximately 1 year patient has been experiencing nonbloody diarrhea, bowel movements anywhere from 4 to 5/day, may or may not be related to food, denies any nocturnal diarrhea, symptoms associated with urgency, denies any abdominal pain or bloating.  Patient denies any weight loss.  She does follow intermittent fasting.  Her labs including CBC, CMP, TSH are unremarkable.  Patient reports undergoing EGD and colonoscopy 7 years ago and colon polyps were removed Patient has not tried Imodium for her diarrhea  Patient lives with her husband and takes care of her father.  Denies any stress in her life or significant events that occurred in her life within last 1 year  Follow-up visit 11/11/2021 Patient is here for follow-up of symptomatic hemorrhoids.  She underwent work-up of chronic diarrhea, found to have severe EPI, pancreatic fecal elastase levels were less than 50.  She underwent EGD and colonoscopy which were unremarkable.  Therefore, I started patient on pancreatic enzyme replacement therapy.  She is interested to undergo hemorrhoid ligation due to persistent hemorrhoidal symptoms  as mentioned above Patient has started taking Creon 36 K 2 capsules with each meal and 1 with snack about a week ago only, she has gained about 2 pounds and she reports feeling 50% better, less number of watery stools and frequency is down to 5 to 6/day  Follow-up visit 11/25/2021 Patient is here for follow-up of EPI and hemorrhoid banding.  Patient reports continued improvement in her symptoms of diarrhea, both frequency and consistency.  She is not wearing pads anymore which she was doing it before due to fear of soiling.  She does report some gurgling after taking enzymes.  She also reports that her hemorrhoidal symptoms have significantly improved since first banding.  Follow-up visit 01/15/2022 Patient is here for follow-up of EPI and she had questions regarding long-term pancreatic enzyme replacement.  She reports doing well since hemorrhoid banding.  She is no longer experiencing hemorrhoidal symptoms.  Her diarrhea is also under control on pancreatic enzymes.  She would like to know if she can replace Creon with digestive enzymes because it will become expensive from her next prescription.  Her main symptom is only diarrhea.  She does not have any other signs or symptoms of malabsorption.  Her weight has been stable and she tries to eat very healthy, maintains low-fat, low-carb diet.  NSAIDs: None  Antiplts/Anticoagulants/Anti thrombotics: None  GI Procedures: EGD and colonoscopy 7 years ago, colon polyps were removed  EGD and colonoscopy 11/04/2021 - Normal duodenal bulb and second portion of the duodenum. Biopsied. - Normal stomach. Biopsied. - Normal gastroesophageal junction and esophagus. - Esophagogastric landmarks identified.  -  Perianal skin tags found on perianal exam. - The examined portion of the ileum was normal. - One 5 mm polyp in the cecum, removed with a cold snare. Resected and retrieved. - Normal mucosa in the entire examined colon. Biopsied. - Diverticulosis in the  recto-sigmoid colon and in the sigmoid colon. - Non-bleeding external and internal hemorrhoids.  DIAGNOSIS:  A. DUODENUM; COLD BIOPSY:  - ENTERIC MUCOSA WITH PRESERVED VILLOUS ARCHITECTURE AND NO SIGNIFICANT  HISTOPATHOLOGIC CHANGE.  - NEGATIVE FOR FEATURES OF CELIAC, DYSPLASIA, AND MALIGNANCY.   B. STOMACH; COLD BIOPSY:  - GASTRIC ANTRAL AND OXYNTIC MUCOSA WITH NO SIGNIFICANT HISTOPATHOLOGIC  CHANGE.  - NEGATIVE FOR H. PYLORI, DYSPLASIA, AND MALIGNANCY.   C. COLON POLYP, CECUM; COLD SNARE:  - TUBULAR ADENOMA.  - NEGATIVE FOR HIGH-GRADE DYSPLASIA AND MALIGNANCY.   D. COLON, RANDOM; COLD BIOPSY:  - BENIGN COLONIC MUCOSA WITH NO SIGNIFICANT HISTOPATHOLOGIC CHANGE.  - NEGATIVE FOR ACTIVE MUCOSAL COLITIS AND FEATURES OF MICROSCOPIC  COLITIS.  - NEGATIVE FOR DYSPLASIA AND MALIGNANCY.    Past Medical History:  Diagnosis Date   Chickenpox    Genital warts    GERD (gastroesophageal reflux disease)    Hyperlipidemia    Urinary tract infection     Past Surgical History:  Procedure Laterality Date   CATARACT EXTRACTION Right 08/29/2019   CATARACT EXTRACTION Left 09/19/2019   COLONOSCOPY WITH ESOPHAGOGASTRODUODENOSCOPY (EGD)     COLONOSCOPY WITH PROPOFOL N/A 11/04/2021   Procedure: COLONOSCOPY WITH PROPOFOL;  Surgeon: Lin Landsman, MD;  Location: [place];  Service: Gastroenterology;  Laterality: N/A;   ESOPHAGOGASTRODUODENOSCOPY (EGD) WITH PROPOFOL N/A 11/04/2021   Procedure: ESOPHAGOGASTRODUODENOSCOPY (EGD) WITH PROPOFOL;  Surgeon: Lin Landsman, MD;  Location: [facility] ENDOSCOPY;  Service: Gastroenterology;  Laterality: N/A;   Current Outpatient Medications:    alendronate (FOSAMAX) 70 MG tablet, Take 1 tablet by mouth once weekly on an empty stomach with water for bone density. Do not lay down within 1 hour after taking., Disp: 12 tablet, Rfl: 3   atorvastatin (LIPITOR) 40 MG tablet, Take 1 tablet (40 mg total) by mouth daily. For cholesterol., Disp: 90 tablet,  Rfl: 3   CALCIUM PO, Take 1,000 mg by mouth daily., Disp: , Rfl:    Cholecalciferol (VITAMIN D PO), Take 1,000 Units by mouth daily., Disp: , Rfl:    lipase/protease/amylase (CREON) 36000 UNITS CPEP capsule, Take 2 capsules with the first bite of each meal and 1 capsule with the first bite of each snack, Disp: 240 capsule, Rfl: 5   MAGNESIUM PO, Take by mouth daily., Disp: , Rfl:    omeprazole (PRILOSEC OTC) 20 MG tablet, Take 20 mg by mouth daily., Disp: , Rfl:     Family History  Problem Relation Age of Onset   Arthritis Mother    Diabetes Mother    Hearing loss Mother    Hypertension Mother    Kidney cancer Mother    Hearing loss Father    Stomach cancer Father    Asthma Brother    Heart Problems Brother    Breast cancer Neg Hx      Social History   Tobacco Use   Smoking status: Former   Smokeless tobacco: Never  Vaping Use   Vaping Use: Never used  Substance Use Topics   Alcohol use: Yes    Comment: 1 glass of wine per month   Drug use: Not Currently    Allergies as of 01/15/2022 - Review Complete 01/15/2022  Allergen Reaction Noted   Codeine  Review of Systems:    All systems reviewed and negative except where noted in HPI.   Physical Exam:  BP 134/76 (BP Location: Left Arm, Patient Position: Sitting, Cuff Size: Normal)   Pulse 71   Temp 97.8 F (36.6 C) (Oral)   Ht '4\' 11"'$  (1.499 m)   Wt 116 lb 8 oz (52.8 kg)   BMI 23.53 kg/m  No LMP recorded. Patient is postmenopausal.  General:   Alert,  Well-developed, well-nourished, pleasant and cooperative in NAD Head:  Normocephalic and atraumatic. Eyes:  Sclera clear, no icterus.   Conjunctiva pink. Ears:  Normal auditory acuity. Nose:  No deformity, discharge, or lesions. Mouth:  No deformity or lesions,oropharynx pink & moist. Neck:  Supple; no masses or thyromegaly. Lungs:  Respirations even and unlabored.  Clear throughout to auscultation.   No wheezes, crackles, or rhonchi. No acute distress. Heart:   Regular rate and rhythm; no murmurs, clicks, rubs, or gallops. Abdomen:  Normal bowel sounds. Soft, non-tender and non-distended without masses, hepatosplenomegaly or hernias noted.  No guarding or rebound tenderness.   Rectal: Not performed Msk:  Symmetrical without gross deformities. Good, equal movement & strength bilaterally. Pulses:  Normal pulses noted. Extremities:  No clubbing or edema.  No cyanosis. Neurologic:  Alert and oriented x3;  grossly normal neurologically. Skin:  Intact without significant lesions or rashes. No jaundice. Psych:  Alert and cooperative. Normal mood and affect.  Imaging Studies: No abdominal imaging  Assessment and Plan:   TYNIA WIERS is a 71 y.o. pleasant Caucasian female with history of osteopenia is seen in consultation for chronic diarrhea and bright red blood per rectum on wiping associated with rectal discomfort, perianal itching  Chronic diarrhea secondary to severe EPI: Significantly improved GI profile PCR for negative for infection Pancreatic fecal elastase levels was less than 50 EGD and colonoscopy including biopsies were unremarkable, no evidence of celiac disease, H. pylori infection, IBD, microscopic colitis Continue creon 36 or 72 K with each meal and 36 K with snack.  If patient is not able to afford Creon, will try patient assistance program.  She will also try over-the-counter digestive enzymes as long as she does not develop weight loss or worsening of diarrhea CT abdomen and pelvis with contrast revealed normal pancreas  Grade 1 symptomatic hemorrhoids S/p hemorrhoid ligation x3  Follow up as needed  Cephas Darby, MD

## 2022-01-21 ENCOUNTER — Ambulatory Visit: Payer: PPO | Admitting: Cardiology

## 2022-02-06 ENCOUNTER — Encounter: Payer: Self-pay | Admitting: Cardiology

## 2022-02-06 ENCOUNTER — Ambulatory Visit: Payer: PPO | Admitting: Cardiology

## 2022-02-06 VITALS — BP 128/78 | HR 98 | Ht 59.0 in | Wt 115.2 lb

## 2022-02-06 DIAGNOSIS — I471 Supraventricular tachycardia: Secondary | ICD-10-CM

## 2022-02-06 DIAGNOSIS — I700 Atherosclerosis of aorta: Secondary | ICD-10-CM

## 2022-02-06 DIAGNOSIS — E7800 Pure hypercholesterolemia, unspecified: Secondary | ICD-10-CM | POA: Diagnosis not present

## 2022-02-06 MED ORDER — ASPIRIN 81 MG PO TBEC: 81.0000 mg | DELAYED_RELEASE_TABLET | Freq: Every day | ORAL | Status: AC

## 2022-02-06 NOTE — Progress Notes (Signed)
Cardiology Office Note:    Date:  02/06/2022   ID:  Kayla Ritter, DOB May 29, 1951, MRN [id number]  PCP:  Kayla Koch, NP   [place] Providers Cardiologist:  None     Referring MD: Kayla Koch, NP   Chief Complaint  Patient presents with   Other    6 wk f/u zio. Meds reviewed verbally with pt.    History of Present Illness:    Kayla Ritter is a 71 y.o. female with a hx of hyperlipidemia, GERD who presents for follow-up.  Previously seen due to palpitations.    Palpitations occurred in the setting of starting Creon for pancreatic insufficiency.  Cardiac monitor was placed to evaluate any significant arrhythmias.  She states symptoms overall has improved since our last meeting.  Had an abdominal CT due to chronic diarrhea obtained by gastroenterology, aortic atherosclerosis was noted.  She denies chest pain.  Cardiac monitor 12/2021 paroxysmal SVTs noted  Past Medical History:  Diagnosis Date   Chickenpox    Genital warts    GERD (gastroesophageal reflux disease)    Hyperlipidemia    Urinary tract infection     Past Surgical History:  Procedure Laterality Date   CATARACT EXTRACTION Right 08/29/2019   CATARACT EXTRACTION Left 09/19/2019   COLONOSCOPY WITH ESOPHAGOGASTRODUODENOSCOPY (EGD)     COLONOSCOPY WITH PROPOFOL N/A 11/04/2021   Procedure: COLONOSCOPY WITH PROPOFOL;  Surgeon: Lin Landsman, MD;  Location: [place];  Service: Gastroenterology;  Laterality: N/A;   ESOPHAGOGASTRODUODENOSCOPY (EGD) WITH PROPOFOL N/A 11/04/2021   Procedure: ESOPHAGOGASTRODUODENOSCOPY (EGD) WITH PROPOFOL;  Surgeon: Lin Landsman, MD;  Location: [facility] ENDOSCOPY;  Service: Gastroenterology;  Laterality: N/A;    Current Medications: Current Meds  Medication Sig   alendronate (FOSAMAX) 70 MG tablet Take 1 tablet by mouth once weekly on an empty stomach with water for bone density. Do not lay down within 1 hour after taking.   aspirin EC 81 MG tablet  Take 1 tablet (81 mg total) by mouth daily. Swallow whole.   atorvastatin (LIPITOR) 40 MG tablet Take 1 tablet (40 mg total) by mouth daily. For cholesterol.   CALCIUM PO Take 1,000 mg by mouth daily.   Cholecalciferol (VITAMIN D PO) Take 1,000 Units by mouth daily.   lipase/protease/amylase (CREON) 36000 UNITS CPEP capsule Take 2 capsules with the first bite of each meal and 1 capsule with the first bite of each snack   MAGNESIUM PO Take by mouth daily.   omeprazole (PRILOSEC OTC) 20 MG tablet Take 20 mg by mouth daily.     Allergies:   Codeine   Social History   Socioeconomic History   Marital status: Married    Spouse name: Not on file   Number of children: Not on file   Years of education: Not on file   Highest education level: Not on file  Occupational History   Not on file  Tobacco Use   Smoking status: Former   Smokeless tobacco: Never  Vaping Use   Vaping Use: Never used  Substance and Sexual Activity   Alcohol use: Yes    Comment: 1 glass of wine per month   Drug use: Not Currently   Sexual activity: Not on file  Other Topics Concern   Not on file  Social History Narrative   ** Merged History Encounter **       Married. 4 children, 5 grandchildren.  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  Enjoys  reading, crossword puzzles, spending time with grandchildren.    Social Determinants of Health   Financial Resource Strain: Low Risk  (04/22/2021)   Overall Financial Resource Strain (CARDIA)    Difficulty of Paying Living Expenses: Not hard at all  Food Insecurity: No Food Insecurity (04/22/2021)   Hunger Vital Sign    Worried About Running Out of Food in the Last Year: Never true    Ran Out of Food in the Last Year: Never true  Transportation Needs: No Transportation Needs (04/22/2021)   PRAPARE - Hydrologist (Medical): No    Lack of Transportation (Non-Medical): No  Physical Activity: Sufficiently Active  (04/22/2021)   Exercise Vital Sign    Days of Exercise per Week: 7 days    Minutes of Exercise per Session: 30 min  Stress: No Stress Concern Present (04/22/2021)   [place]    Feeling of Stress : Not at all  Social Connections: [place] (04/22/2021)   Social Connection and Isolation Panel [NHANES]    Frequency of Communication with Friends and Family: More than three times a week    Frequency of Social Gatherings with Friends and Family: Twice a week    Attends Religious Services: More than 4 times per year    Active Member of [employer] or Organizations: Yes    Attends Music therapist: More than 4 times per year    Marital Status: Married     Family History: The patient's family history includes Arthritis in her mother; Asthma in her brother; Diabetes in her mother; Hearing loss in her father and mother; Heart Problems in her brother; Hypertension in her mother; Kidney cancer in her mother; Stomach cancer in her father. There is no history of Breast cancer.  ROS:   Please see the history of present illness.     All other systems reviewed and are negative.  EKGs/Labs/Other Studies Reviewed:    The following studies were reviewed today:   EKG:  EKG not  ordered today.    Recent Labs: 05/13/2021: ALT 14 12/08/2021: BUN 10; Creatinine, Ser 0.78; Hemoglobin 13.6; Magnesium 2.2; Platelets 246; Potassium 3.7; Sodium 140; TSH 0.841  Recent Lipid Panel    Component Value Date/Time   CHOL 137 05/13/2021 1211   TRIG 144.0 05/13/2021 1211   HDL 53.00 05/13/2021 1211   CHOLHDL 3 05/13/2021 1211   VLDL 28.8 05/13/2021 1211   LDLCALC 55 05/13/2021 1211   LDLDIRECT 162.0 03/14/2018 0849     Risk Assessment/Calculations:          Physical Exam:    VS:  BP 128/78 (BP Location: Left Arm, Patient Position: Sitting, Cuff Size: Normal)   Pulse 98   Ht '4\' 11"'$  (1.499 m)   Wt 115 lb 4 oz (52.3 kg)    SpO2 98%   BMI 23.28 kg/m     Wt Readings from Last 3 Encounters:  02/06/22 115 lb 4 oz (52.3 kg)  01/15/22 116 lb 8 oz (52.8 kg)  12/12/21 116 lb 2 oz (52.7 kg)     GEN:  Well nourished, well developed in no acute distress HEENT: Normal NECK: No JVD; No carotid bruits CARDIAC: RRR, no murmurs, rubs, gallops RESPIRATORY:  Clear to auscultation without rales, wheezing or rhonchi  ABDOMEN: Soft, non-tender, non-distended MUSCULOSKELETAL:  No edema; No deformity  SKIN: Warm and dry NEUROLOGIC:  Alert and oriented x 3 PSYCHIATRIC:  Normal affect   ASSESSMENT:  1. Paroxysmal SVT (supraventricular tachycardia) (HCC)   2. Pure hypercholesterolemia   3. Abdominal aortic atherosclerosis (HCC)     PLAN:    In order of problems listed above:  Palpitations, cardiac monitor showed paroxysmal SVT, symptoms overall improved.  Obtain echocardiogram.  Monitor symptoms of beta-blockers.  Consider beta-blockers if symptoms return. Hyperlipidemia, cholesterol controlled, continue Lipitor. Abdominal aortic atherosclerosis, cholesterol controlled, continue Lipitor, start aspirin 81 mg daily   Follow-up in 2 months after echo     Medication Adjustments/Labs and Tests Ordered: Current medicines are reviewed at length with the patient today.  Concerns regarding medicines are outlined above.  Orders Placed This Encounter  Procedures   ECHOCARDIOGRAM COMPLETE   Meds ordered this encounter  Medications   aspirin EC 81 MG tablet    Sig: Take 1 tablet (81 mg total) by mouth daily. Swallow whole.    Patient Instructions  Medication Instructions:   Your physician has recommended you make the following change in your medication:   START Aspirin 81 mg daily - you may purchase over-the-counter  *If you need a refill on your cardiac medications before your next appointment, please call your pharmacy*   Lab Work:  None ordered  Testing/Procedures:  Your physician has requested that  you have an echocardiogram. Echocardiography is a painless test that uses sound waves to create images of your heart. It provides your doctor with information about the size and shape of your heart and how well your heart's chambers and valves are working. This procedure takes approximately one hour. There are no restrictions for this procedure.   Follow-Up: At [facility], you and your health needs are our priority.  As part of our continuing mission to provide you with exceptional heart care, we have created designated Provider Care Teams.  These Care Teams include your primary Cardiologist (physician) and Advanced Practice Providers (APPs -  Physician Assistants and Nurse Practitioners) who all work together to provide you with the care you need, when you need it.  We recommend signing up for the patient portal called "MyChart".  Sign up information is provided on this After Visit Summary.  MyChart is used to connect with patients for Virtual Visits (Telemedicine).  Patients are able to view lab/test results, encounter notes, upcoming appointments, etc.  Non-urgent messages can be sent to your provider as well.   To learn more about what you can do with MyChart, go to [email or web address].    Your next appointment:   2 month(s)  The format for your next appointment:   In Person  Provider:   You may see Dr. Garen Lah or one of the following Advanced Practice Providers on your designated Care Team:   Murray Hodgkins, NP Christell Faith, PA-C Cadence Kathlen Mody, [place]    Important Information About Sugar         Signed, Kate Sable, MD  02/06/2022 4:31 PM    [place]

## 2022-02-06 NOTE — Patient Instructions (Signed)
Medication Instructions:   Your physician has recommended you make the following change in your medication:   START Aspirin 81 mg daily - you may purchase over-the-counter  *If you need a refill on your cardiac medications before your next appointment, please call your pharmacy*   Lab Work:  None ordered  Testing/Procedures:  Your physician has requested that you have an echocardiogram. Echocardiography is a painless test that uses sound waves to create images of your heart. It provides your doctor with information about the size and shape of your heart and how well your heart's chambers and valves are working. This procedure takes approximately one hour. There are no restrictions for this procedure.   Follow-Up: At [facility], you and your health needs are our priority.  As part of our continuing mission to provide you with exceptional heart care, we have created designated Provider Care Teams.  These Care Teams include your primary Cardiologist (physician) and Advanced Practice Providers (APPs -  Physician Assistants and Nurse Practitioners) who all work together to provide you with the care you need, when you need it.  We recommend signing up for the patient portal called "MyChart".  Sign up information is provided on this After Visit Summary.  MyChart is used to connect with patients for Virtual Visits (Telemedicine).  Patients are able to view lab/test results, encounter notes, upcoming appointments, etc.  Non-urgent messages can be sent to your provider as well.   To learn more about what you can do with MyChart, go to [email or web address].    Your next appointment:   2 month(s)  The format for your next appointment:   In Person  Provider:   You may see Dr. Garen Lah or one of the following Advanced Practice Providers on your designated Care Team:   Murray Hodgkins, NP Christell Faith, PA-C Cadence Kathlen Mody, [place]    Important Information About Sugar

## 2022-02-13 ENCOUNTER — Telehealth: Payer: Self-pay | Admitting: Primary Care

## 2022-02-13 ENCOUNTER — Encounter: Payer: Self-pay | Admitting: Gastroenterology

## 2022-02-13 DIAGNOSIS — E278 Other specified disorders of adrenal gland: Secondary | ICD-10-CM

## 2022-02-13 NOTE — Telephone Encounter (Addendum)
-----   Message from Pleas Koch, NP sent at 11/20/2021 12:52 PM EDT ----- Regarding: CT scan due Patient due for CT adrenal protocol with and without contrast given the 2.1 cm adrenal nodule seen on CT scan from June 2023.  Is she agreeable? [place] or [place]?

## 2022-02-17 NOTE — Telephone Encounter (Signed)
Noted, CT scan ordered for [facility]

## 2022-02-17 NOTE — Addendum Note (Signed)
Addended by: Pleas Koch on: 02/17/2022 10:25 PM   Modules accepted: Orders

## 2022-02-17 NOTE — Telephone Encounter (Signed)
Patient agreed to CT, would like in [place].

## 2022-02-19 ENCOUNTER — Encounter: Payer: Self-pay | Admitting: *Deleted

## 2022-02-20 NOTE — Telephone Encounter (Signed)
Central Scheduling called stated the order should be CT ABDOMEN WITH OR WITHOUT CONTRAST. Call back number [phone].

## 2022-02-24 NOTE — Telephone Encounter (Signed)
Order has been changed

## 2022-03-03 ENCOUNTER — Other Ambulatory Visit: Payer: Self-pay | Admitting: Primary Care

## 2022-03-03 ENCOUNTER — Ambulatory Visit
Admission: RE | Admit: 2022-03-03 | Discharge: 2022-03-03 | Disposition: A | Discharge status: Home / Self Care | Payer: PPO | Source: Ambulatory Visit | Attending: Primary Care | Admitting: Primary Care

## 2022-03-03 DIAGNOSIS — I700 Atherosclerosis of aorta: Secondary | ICD-10-CM | POA: Insufficient documentation

## 2022-03-03 DIAGNOSIS — E278 Other specified disorders of adrenal gland: Secondary | ICD-10-CM | POA: Diagnosis present

## 2022-03-03 DIAGNOSIS — D3501 Benign neoplasm of right adrenal gland: Secondary | ICD-10-CM | POA: Insufficient documentation

## 2022-03-10 ENCOUNTER — Other Ambulatory Visit: Payer: Self-pay | Admitting: Primary Care

## 2022-03-10 DIAGNOSIS — E785 Hyperlipidemia, unspecified: Secondary | ICD-10-CM

## 2022-03-12 ENCOUNTER — Ambulatory Visit: Payer: PPO | Attending: Cardiology

## 2022-03-12 DIAGNOSIS — I471 Supraventricular tachycardia: Secondary | ICD-10-CM

## 2022-03-12 LAB — ECHOCARDIOGRAM COMPLETE
AR max vel: 2.34 cm2
AV Area VTI: 2.31 cm2
AV Area mean vel: 2.38 cm2
AV Mean grad: 3 mmHg
AV Peak grad: 5.2 mmHg
Ao pk vel: 1.14 m/s
Area-P 1/2: 4.17 cm2
Calc EF: 74 %
P 1/2 time: 1174 msec
S' Lateral: 2.7 cm
Single Plane A2C EF: 73.3 %
Single Plane A4C EF: 74.2 %

## 2022-04-09 ENCOUNTER — Encounter: Payer: Self-pay | Admitting: Cardiology

## 2022-04-09 ENCOUNTER — Ambulatory Visit: Payer: PPO | Attending: Cardiology | Admitting: Cardiology

## 2022-04-09 VITALS — BP 118/64 | HR 62 | Ht 59.0 in | Wt 118.0 lb

## 2022-04-09 DIAGNOSIS — I471 Supraventricular tachycardia, unspecified: Secondary | ICD-10-CM | POA: Diagnosis not present

## 2022-04-09 DIAGNOSIS — E7800 Pure hypercholesterolemia, unspecified: Secondary | ICD-10-CM | POA: Diagnosis not present

## 2022-04-09 DIAGNOSIS — I700 Atherosclerosis of aorta: Secondary | ICD-10-CM | POA: Diagnosis not present

## 2022-04-09 NOTE — Progress Notes (Signed)
Cardiology Office Note:    Date:  04/09/2022   ID:  Kayla Ritter, DOB 08/03/1950, MRN [id number]  PCP:  Pleas Koch, NP   [place] Providers Cardiologist:  None     Referring MD: Pleas Koch, NP   Chief Complaint  Patient presents with   Follow-up    2 month follow up, no new Cardiac concerns     History of Present Illness:    Kayla Ritter is a 71 y.o. female with a hx of hyperlipidemia, paroxysmal SVT, GERD, abdominal aortic atherosclerosis who presents for follow-up.    Previously seen due to palpitations.  Cardiac monitor was placed revealing paroxysmal SVTs.  Echocardiogram ordered to evaluate any structural abnormalities.  Symptoms overall improved without AV nodal agents. Palpitations occurred in the setting of starting Creon for pancreatic insufficiency.   Prior notes Cardiac monitor 12/2021 paroxysmal SVTs noted CT abdomen 02/2019 3 aortic atherosclerosis  Past Medical History:  Diagnosis Date   Chickenpox    Genital warts    GERD (gastroesophageal reflux disease)    Hyperlipidemia    Urinary tract infection     Past Surgical History:  Procedure Laterality Date   CATARACT EXTRACTION Right 08/29/2019   CATARACT EXTRACTION Left 09/19/2019   COLONOSCOPY WITH ESOPHAGOGASTRODUODENOSCOPY (EGD)     COLONOSCOPY WITH PROPOFOL N/A 11/04/2021   Procedure: COLONOSCOPY WITH PROPOFOL;  Surgeon: Lin Landsman, MD;  Location: [facility];  Service: Gastroenterology;  Laterality: N/A;   ESOPHAGOGASTRODUODENOSCOPY (EGD) WITH PROPOFOL N/A 11/04/2021   Procedure: ESOPHAGOGASTRODUODENOSCOPY (EGD) WITH PROPOFOL;  Surgeon: Lin Landsman, MD;  Location: [facility];  Service: Gastroenterology;  Laterality: N/A;    Current Medications: Current Meds  Medication Sig   alendronate (FOSAMAX) 70 MG tablet Take 1 tablet by mouth once weekly on an empty stomach with water for bone density. Do not lay down within 1 hour after taking.   aspirin  EC 81 MG tablet Take 1 tablet (81 mg total) by mouth daily. Swallow whole.   atorvastatin (LIPITOR) 40 MG tablet Take 1 tablet (40 mg total) by mouth daily. For cholesterol.   CALCIUM PO Take 1,000 mg by mouth daily.   Cholecalciferol (VITAMIN D PO) Take 1,000 Units by mouth daily.   MAGNESIUM PO Take by mouth daily.   omeprazole (PRILOSEC OTC) 20 MG tablet Take 20 mg by mouth daily.     Allergies:   Codeine   Social History   Socioeconomic History   Marital status: Married    Spouse name: Not on file   Number of children: Not on file   Years of education: Not on file   Highest education level: Not on file  Occupational History   Not on file  Tobacco Use   Smoking status: Former    Types: Cigarettes    Quit date: 06/15/1978    Years since quitting: 43.8   Smokeless tobacco: Never   Tobacco comments:    Stopping smoking 1980  Vaping Use   Vaping Use: Never used  Substance and Sexual Activity   Alcohol use: Yes    Comment: 1 glass of wine per month   Drug use: Not Currently   Sexual activity: Not on file  Other Topics Concern   Not on file  Social History Narrative   ** Merged History Encounter **       Married. 4 children, 5 grandchildren.  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  Enjoys reading, crossword puzzles, spending time  with grandchildren.    Social Determinants of Health   Financial Resource Strain: Low Risk  (04/22/2021)   Overall Financial Resource Strain (CARDIA)    Difficulty of Paying Living Expenses: Not hard at all  Food Insecurity: No Food Insecurity (04/22/2021)   Hunger Vital Sign    Worried About Running Out of Food in the Last Year: Never true    Ran Out of Food in the Last Year: Never true  Transportation Needs: No Transportation Needs (04/22/2021)   PRAPARE - Hydrologist (Medical): No    Lack of Transportation (Non-Medical): No  Physical Activity: Sufficiently Active (04/22/2021)    Exercise Vital Sign    Days of Exercise per Week: 7 days    Minutes of Exercise per Session: 30 min  Stress: No Stress Concern Present (04/22/2021)   [place]    Feeling of Stress : Not at all  Social Connections: [place] (04/22/2021)   Social Connection and Isolation Panel [NHANES]    Frequency of Communication with Friends and Family: More than three times a week    Frequency of Social Gatherings with Friends and Family: Twice a week    Attends Religious Services: More than 4 times per year    Active Member of [employer] or Organizations: Yes    Attends Music therapist: More than 4 times per year    Marital Status: Married     Family History: The patient's family history includes Arthritis in her mother; Asthma in her brother; Diabetes in her mother; Hearing loss in her father and mother; Heart Problems in her brother; Hypertension in her mother; Kidney cancer in her mother; Stomach cancer in her father. There is no history of Breast cancer.  ROS:   Please see the history of present illness.     All other systems reviewed and are negative.  EKGs/Labs/Other Studies Reviewed:    The following studies were reviewed today:   EKG:  EKG is ordered today.  EKG shows normal sinus rhythm, normal ECG.  Recent Labs: 05/13/2021: ALT 14 12/08/2021: BUN 10; Creatinine, Ser 0.78; Hemoglobin 13.6; Magnesium 2.2; Platelets 246; Potassium 3.7; Sodium 140; TSH 0.841  Recent Lipid Panel    Component Value Date/Time   CHOL 137 05/13/2021 1211   TRIG 144.0 05/13/2021 1211   HDL 53.00 05/13/2021 1211   CHOLHDL 3 05/13/2021 1211   VLDL 28.8 05/13/2021 1211   LDLCALC 55 05/13/2021 1211   LDLDIRECT 162.0 03/14/2018 0849     Risk Assessment/Calculations:          Physical Exam:    VS:  BP 118/64 (BP Location: Left Arm, Patient Position: Sitting, Cuff Size: Normal)   Pulse 62   Ht '4\' 11"'$  (1.499 m)   Wt  118 lb (53.5 kg)   SpO2 98%   BMI 23.83 kg/m     Wt Readings from Last 3 Encounters:  04/09/22 118 lb (53.5 kg)  02/06/22 115 lb 4 oz (52.3 kg)  01/15/22 116 lb 8 oz (52.8 kg)     GEN:  Well nourished, well developed in no acute distress HEENT: Normal NECK: No JVD; No carotid bruits CARDIAC: RRR, no murmurs, rubs, gallops RESPIRATORY:  Clear to auscultation without rales, wheezing or rhonchi  ABDOMEN: Soft, non-tender, non-distended MUSCULOSKELETAL:  No edema; No deformity  SKIN: Warm and dry NEUROLOGIC:  Alert and oriented x 3 PSYCHIATRIC:  Normal affect   ASSESSMENT:    1.  Paroxysmal SVT (supraventricular tachycardia)   2. Pure hypercholesterolemia   3. Abdominal aortic atherosclerosis (HCC)    PLAN:    In order of problems listed above:  Palpitations, cardiac monitor showed paroxysmal SVT, symptoms overall improved.  Echocardiogram shows normal EF 60 to 65%, no gross structural abnormalities.  Continue to monitor symptoms off beta-blockers.  Consider beta-blockers if symptoms return. Hyperlipidemia, cholesterol controlled, continue Lipitor. Abdominal aortic atherosclerosis, continue aspirin, Lipitor 40 mg daily.  LDL at goal.   Follow-up in 1 year.     Medication Adjustments/Labs and Tests Ordered: Current medicines are reviewed at length with the patient today.  Concerns regarding medicines are outlined above.  Orders Placed This Encounter  Procedures   EKG 12-Lead   No orders of the defined types were placed in this encounter.   Patient Instructions  Medication Instructions:   Your physician recommends that you continue on your current medications as directed. Please refer to the Current Medication list given to you today.   *If you need a refill on your cardiac medications before your next appointment, please call your pharmacy*   Follow-Up: At [facility], you and your health needs are our priority.  As part of our continuing mission to  provide you with exceptional heart care, we have created designated Provider Care Teams.  These Care Teams include your primary Cardiologist (physician) and Advanced Practice Providers (APPs -  Physician Assistants and Nurse Practitioners) who all work together to provide you with the care you need, when you need it.  We recommend signing up for the patient portal called "MyChart".  Sign up information is provided on this After Visit Summary.  MyChart is used to connect with patients for Virtual Visits (Telemedicine).  Patients are able to view lab/test results, encounter notes, upcoming appointments, etc.  Non-urgent messages can be sent to your provider as well.   To learn more about what you can do with MyChart, go to [email or web address].    Your next appointment:   1 year(s)  The format for your next appointment:   In Person  Provider:   Kate Sable, MD    Other Instructions   Important Information About Sugar         Signed, Kate Sable, MD  04/09/2022 10:01 AM    [place]

## 2022-04-09 NOTE — Patient Instructions (Signed)
Medication Instructions:   Your physician recommends that you continue on your current medications as directed. Please refer to the Current Medication list given to you today.  *If you need a refill on your cardiac medications before your next appointment, please call your pharmacy*   Follow-Up: At [facility], you and your health needs are our priority.  As part of our continuing mission to provide you with exceptional heart care, we have created designated Provider Care Teams.  These Care Teams include your primary Cardiologist (physician) and Advanced Practice Providers (APPs -  Physician Assistants and Nurse Practitioners) who all work together to provide you with the care you need, when you need it.  We recommend signing up for the patient portal called "MyChart".  Sign up information is provided on this After Visit Summary.  MyChart is used to connect with patients for Virtual Visits (Telemedicine).  Patients are able to view lab/test results, encounter notes, upcoming appointments, etc.  Non-urgent messages can be sent to your provider as well.   To learn more about what you can do with MyChart, go to [email or web address].    Your next appointment:   1 year(s)  The format for your next appointment:   In Person  Provider:   Brian Agbor-Etang, MD    Other Instructions   Important Information About Sugar       

## 2022-04-20 ENCOUNTER — Telehealth: Payer: Self-pay | Admitting: Primary Care

## 2022-04-20 NOTE — Telephone Encounter (Signed)
Returned patients call Patient returned my call left message

## 2022-04-20 NOTE — Telephone Encounter (Signed)
Left message for patient to call back and schedule Medicare Annual Wellness Visit (AWV) either virtually or phone . Left  my Herbie Drape number [phone]   Last AWV  04/22/21    45 min for awv-i and in office appointments 30 min for awv-s  phone/virtual appointments

## 2022-05-06 ENCOUNTER — Ambulatory Visit (INDEPENDENT_AMBULATORY_CARE_PROVIDER_SITE_OTHER): Payer: PPO

## 2022-05-06 VITALS — Ht 59.0 in | Wt 115.0 lb

## 2022-05-06 DIAGNOSIS — Z0000 Encounter for general adult medical examination without abnormal findings: Secondary | ICD-10-CM | POA: Diagnosis not present

## 2022-05-06 NOTE — Patient Instructions (Addendum)
Kayla Ritter , Thank you for taking time to come for your Medicare Wellness Visit. I appreciate your ongoing commitment to your health goals. Please review the following plan we discussed and let me know if I can assist you in the future.   These are the goals we discussed:  Goals       DIET - INCREASE WATER INTAKE      Starting 03/14/2018, I will continue to drink at least 6-8 glasses of water daily.      No current goals (pt-stated)      Patient Stated      03/17/2019, Patient wants to maintain and continue medications as prescribed.       Patient Stated      04/19/2020, I will continue to walk everyday for about 30 minutes.       Patient Stated      Maintain current lifestyle        This is a list of the screening recommended for you and due dates:  Health Maintenance  Topic Date Due   Hemoglobin A1C  11/10/2021   Yearly kidney health urinalysis for diabetes  05/13/2022   Eye exam for diabetics  05/06/2022*   Complete foot exam   05/07/2022*   COVID-19 Vaccine (3 - Pfizer risk series) 05/22/2022*   Zoster (Shingles) Vaccine (2 of 2) 08/06/2022*   Flu Shot  09/13/2022*   Yearly kidney function blood test for diabetes  12/09/2022   Medicare Annual Wellness Visit  05/07/2023   Mammogram  08/07/2023   Colon Cancer Screening  11/05/2026   Pneumonia Vaccine  Completed   DEXA scan (bone density measurement)  Completed   Hepatitis C Screening: USPSTF Recommendation to screen - Ages 97-79 yo.  Completed   HPV Vaccine  Aged Out  *Topic was postponed. The date shown is not the original due date.    Advanced directives: In chart  Conditions/risks identified: None  Next appointment: Follow up in one year for your annual wellness visit     Preventive Care 65 Years and Older, Female Preventive care refers to lifestyle choices and visits with your health care provider that can promote health and wellness. What does preventive care include? A yearly physical exam. This is also called  an annual well check. Dental exams once or twice a year. Routine eye exams. Ask your health care provider how often you should have your eyes checked. Personal lifestyle choices, including: Daily care of your teeth and gums. Regular physical activity. Eating a healthy diet. Avoiding tobacco and drug use. Limiting alcohol use. Practicing safe sex. Taking low-dose aspirin every day. Taking vitamin and mineral supplements as recommended by your health care provider. What happens during an annual well check? The services and screenings done by your health care provider during your annual well check will depend on your age, overall health, lifestyle risk factors, and family history of disease. Counseling  Your health care provider may ask you questions about your: Alcohol use. Tobacco use. Drug use. Emotional well-being. Home and relationship well-being. Sexual activity. Eating habits. History of falls. Memory and ability to understand (cognition). Work and work Statistician. Reproductive health. Screening  You may have the following tests or measurements: Height, weight, and BMI. Blood pressure. Lipid and cholesterol levels. These may be checked every 5 years, or more frequently if you are over 9 years old. Skin check. Lung cancer screening. You may have this screening every year starting at age 42 if you have a 30-pack-year history of  smoking and currently smoke or have quit within the past 15 years. Fecal occult blood test (FOBT) of the stool. You may have this test every year starting at age 54. Flexible sigmoidoscopy or colonoscopy. You may have a sigmoidoscopy every 5 years or a colonoscopy every 10 years starting at age 63. Hepatitis C blood test. Hepatitis B blood test. Sexually transmitted disease (STD) testing. Diabetes screening. This is done by checking your blood sugar (glucose) after you have not eaten for a while (fasting). You may have this done every 1-3  years. Bone density scan. This is done to screen for osteoporosis. You may have this done starting at age 64. Mammogram. This may be done every 1-2 years. Talk to your health care provider about how often you should have regular mammograms. Talk with your health care provider about your test results, treatment options, and if necessary, the need for more tests. Vaccines  Your health care provider may recommend certain vaccines, such as: Influenza vaccine. This is recommended every year. Tetanus, diphtheria, and acellular pertussis (Tdap, Td) vaccine. You may need a Td booster every 10 years. Zoster vaccine. You may need this after age 19. Pneumococcal 13-valent conjugate (PCV13) vaccine. One dose is recommended after age 65. Pneumococcal polysaccharide (PPSV23) vaccine. One dose is recommended after age 30. Talk to your health care provider about which screenings and vaccines you need and how often you need them. This information is not intended to replace advice given to you by your health care provider. Make sure you discuss any questions you have with your health care provider. Document Released: 06/28/2015 Document Revised: 02/19/2016 Document Reviewed: 04/02/2015 Elsevier Interactive Patient Education  2017 [place] Prevention in the Home Falls can cause injuries. They can happen to people of all ages. There are many things you can do to make your home safe and to help prevent falls. What can I do on the outside of my home? Regularly fix the edges of walkways and driveways and fix any cracks. Remove anything that might make you trip as you walk through a door, such as a raised step or threshold. Trim any bushes or trees on the path to your home. Use bright outdoor lighting. Clear any walking paths of anything that might make someone trip, such as rocks or tools. Regularly check to see if handrails are loose or broken. Make sure that both sides of any steps have  handrails. Any raised decks and porches should have guardrails on the edges. Have any leaves, snow, or ice cleared regularly. Use sand or salt on walking paths during winter. Clean up any spills in your garage right away. This includes oil or grease spills. What can I do in the bathroom? Use night lights. Install grab bars by the toilet and in the tub and shower. Do not use towel bars as grab bars. Use non-skid mats or decals in the tub or shower. If you need to sit down in the shower, use a plastic, non-slip stool. Keep the floor dry. Clean up any water that spills on the floor as soon as it happens. Remove soap buildup in the tub or shower regularly. Attach bath mats securely with double-sided non-slip rug tape. Do not have throw rugs and other things on the floor that can make you trip. What can I do in the bedroom? Use night lights. Make sure that you have a light by your bed that is easy to reach. Do not use any sheets or blankets that  are too big for your bed. They should not hang down onto the floor. Have a firm chair that has side arms. You can use this for support while you get dressed. Do not have throw rugs and other things on the floor that can make you trip. What can I do in the kitchen? Clean up any spills right away. Avoid walking on wet floors. Keep items that you use a lot in easy-to-reach places. If you need to reach something above you, use a strong step stool that has a grab bar. Keep electrical cords out of the way. Do not use floor polish or wax that makes floors slippery. If you must use wax, use non-skid floor wax. Do not have throw rugs and other things on the floor that can make you trip. What can I do with my stairs? Do not leave any items on the stairs. Make sure that there are handrails on both sides of the stairs and use them. Fix handrails that are broken or loose. Make sure that handrails are as long as the stairways. Check any carpeting to make sure  that it is firmly attached to the stairs. Fix any carpet that is loose or worn. Avoid having throw rugs at the top or bottom of the stairs. If you do have throw rugs, attach them to the floor with carpet tape. Make sure that you have a light switch at the top of the stairs and the bottom of the stairs. If you do not have them, ask someone to add them for you. What else can I do to help prevent falls? Wear shoes that: Do not have high heels. Have rubber bottoms. Are comfortable and fit you well. Are closed at the toe. Do not wear sandals. If you use a stepladder: Make sure that it is fully opened. Do not climb a closed stepladder. Make sure that both sides of the stepladder are locked into place. Ask someone to hold it for you, if possible. Clearly mark and make sure that you can see: Any grab bars or handrails. First and last steps. Where the edge of each step is. Use tools that help you move around (mobility aids) if they are needed. These include: Canes. Walkers. Scooters. Crutches. Turn on the lights when you go into a dark area. Replace any light bulbs as soon as they burn out. Set up your furniture so you have a clear path. Avoid moving your furniture around. If any of your floors are uneven, fix them. If there are any pets around you, be aware of where they are. Review your medicines with your doctor. Some medicines can make you feel dizzy. This can increase your chance of falling. Ask your doctor what other things that you can do to help prevent falls. This information is not intended to replace advice given to you by your health care provider. Make sure you discuss any questions you have with your health care provider. Document Released: 03/28/2009 Document Revised: 11/07/2015 Document Reviewed: 07/06/2014 Elsevier Interactive Patient Education  2017 [employer].

## 2022-05-06 NOTE — Progress Notes (Signed)
Subjective:   Kayla Ritter is a 71 y.o. female who presents for Medicare Annual (Subsequent) preventive examination.  Review of Systems    Virtual Visit via Telephone Note  I connected with  Kayla Ritter on 05/06/22 at  8:45 AM EST by telephone and verified that I am speaking with the correct person using two identifiers.  Location: Patient: Home Provider: Office Persons participating in the virtual visit: patient/Nurse Health Advisor   I discussed the limitations, risks, security and privacy concerns of performing an evaluation and management service by telephone and the availability of in person appointments. The patient expressed understanding and agreed to proceed.  Interactive audio and video telecommunications were attempted between this nurse and patient, however failed, due to patient having technical difficulties OR patient did not have access to video capability.  We continued and completed visit with audio only.  Some vital signs may be absent or patient reported.   Criselda Peaches, LPN  Cardiac Risk Factors include: advanced age (>10mn, >>31women)     Objective:    Today's Vitals   05/06/22 0841  Weight: 115 lb (52.2 kg)  Height: '4\' 11"'$  (1.499 m)   Body mass index is 23.23 kg/m.     05/06/2022    8:48 AM 12/08/2021    3:39 PM 11/04/2021    7:07 AM 04/22/2021    1:16 PM 04/19/2020    1:18 PM 03/17/2019    9:01 AM 03/14/2018    8:12 AM  Advanced Directives  Does Patient Have a Medical Advance Directive? Yes Yes Yes Yes Yes No Yes  Type of AParamedicof ADalzellLiving will Living will [place] will [place]Living will HPiedra GordaLiving will  Living will  Does patient want to make changes to medical advance directive? No - Patient declined   Yes (MAU/Ambulatory/Procedural Areas - Information given)     Copy of [place] Chart? Yes - validated most  recent copy scanned in chart (See row information)  No - copy requested Yes - validated most recent copy scanned in chart (See row information) No - copy requested    Would patient like information on creating a medical advance directive?      No - Patient declined     Current Medications (verified) Outpatient Encounter Medications as of 05/06/2022  Medication Sig   alendronate (FOSAMAX) 70 MG tablet Take 1 tablet by mouth once weekly on an empty stomach with water for bone density. Do not lay down within 1 hour after taking.   aspirin EC 81 MG tablet Take 1 tablet (81 mg total) by mouth daily. Swallow whole.   atorvastatin (LIPITOR) 40 MG tablet Take 1 tablet (40 mg total) by mouth daily. For cholesterol.   CALCIUM PO Take 1,000 mg by mouth daily.   Cholecalciferol (VITAMIN D PO) Take 1,000 Units by mouth daily.   lipase/protease/amylase (CREON) 36000 UNITS CPEP capsule Take 2 capsules with the first bite of each meal and 1 capsule with the first bite of each snack (Patient not taking: Reported on 04/09/2022)   MAGNESIUM PO Take by mouth daily.   omeprazole (PRILOSEC OTC) 20 MG tablet Take 20 mg by mouth daily.   No facility-administered encounter medications on file as of 05/06/2022.    Allergies (verified) Codeine   History: Past Medical History:  Diagnosis Date   Chickenpox    Genital warts    GERD (gastroesophageal reflux disease)  Hyperlipidemia    Urinary tract infection    Past Surgical History:  Procedure Laterality Date   CATARACT EXTRACTION Right 08/29/2019   CATARACT EXTRACTION Left 09/19/2019   COLONOSCOPY WITH ESOPHAGOGASTRODUODENOSCOPY (EGD)     COLONOSCOPY WITH PROPOFOL N/A 11/04/2021   Procedure: COLONOSCOPY WITH PROPOFOL;  Surgeon: Lin Landsman, MD;  Location: [facility];  Service: Gastroenterology;  Laterality: N/A;   ESOPHAGOGASTRODUODENOSCOPY (EGD) WITH PROPOFOL N/A 11/04/2021   Procedure: ESOPHAGOGASTRODUODENOSCOPY (EGD) WITH PROPOFOL;  Surgeon:  Lin Landsman, MD;  Location: [facility];  Service: Gastroenterology;  Laterality: N/A;   Family History  Problem Relation Age of Onset   Arthritis Mother    Diabetes Mother    Hearing loss Mother    Hypertension Mother    Kidney cancer Mother    Hearing loss Father    Stomach cancer Father    Asthma Brother    Heart Problems Brother    Breast cancer Neg Hx    Social History   Socioeconomic History   Marital status: Married    Spouse name: Not on file   Number of children: Not on file   Years of education: Not on file   Highest education level: Not on file  Occupational History   Not on file  Tobacco Use   Smoking status: Former    Types: Cigarettes    Quit date: 06/15/1978    Years since quitting: 43.9   Smokeless tobacco: Never   Tobacco comments:    Stopping smoking 1980  Vaping Use   Vaping Use: Never used  Substance and Sexual Activity   Alcohol use: Yes    Comment: 1 glass of wine per month   Drug use: Not Currently   Sexual activity: Not on file  Other Topics Concern   Not on file  Social History Narrative   ** Merged History Encounter **       Married. 4 children, 5 grandchildren.  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  Enjoys reading, crossword puzzles, spending time with grandchildren.    Social Determinants of Health   Financial Resource Strain: Low Risk  (05/06/2022)   Overall Financial Resource Strain (CARDIA)    Difficulty of Paying Living Expenses: Not hard at all  Food Insecurity: No Food Insecurity (05/06/2022)   Hunger Vital Sign    Worried About Running Out of Food in the Last Year: Never true    Ran Out of Food in the Last Year: Never true  Transportation Needs: No Transportation Needs (04/22/2021)   PRAPARE - Hydrologist (Medical): No    Lack of Transportation (Non-Medical): No  Physical Activity: Insufficiently Active (05/06/2022)   Exercise Vital Sign    Days of  Exercise per Week: 3 days    Minutes of Exercise per Session: 40 min  Stress: No Stress Concern Present (05/06/2022)   [place]    Feeling of Stress : Not at all  Social Connections: [place] (05/06/2022)   Social Connection and Isolation Panel [NHANES]    Frequency of Communication with Friends and Family: More than three times a week    Frequency of Social Gatherings with Friends and Family: More than three times a week    Attends Religious Services: More than 4 times per year    Active Member of [employer] or Organizations: Yes    Attends Archivist Meetings: More than 4 times per year  Marital Status: Married    Tobacco Counseling Counseling given: Not Answered Tobacco comments: Stopping smoking 1980   Clinical Intake:  Pre-visit preparation completed: Yes  Pain : No/denies pain     BMI - recorded: 23.23 Nutritional Status: BMI of 19-24  Normal Nutritional Risks: None Diabetes: No  How often do you need to have someone help you when you read instructions, pamphlets, or other written materials from your doctor or pharmacy?: 1 - Never  Diabetic?  No  Interpreter Needed?: No  Information entered by :: Kayla Arbour LPN   Activities of Daily Living    05/06/2022    8:46 AM 05/05/2022   10:03 AM  In your present state of health, do you have any difficulty performing the following activities:  Hearing? 0 0  Vision? 0 0  Difficulty concentrating or making decisions? 0 0  Walking or climbing stairs? 0 0  Dressing or bathing? 0 0  Doing errands, shopping? 0 0  Preparing Food and eating ? N N  Using the Toilet? N N  In the past six months, have you accidently leaked urine? N N  Do you have problems with loss of bowel control? N N  Managing your Medications? N N  Managing your Finances? N N  Housekeeping or managing your Housekeeping? N N    Patient Care Team: Pleas Koch, NP as PCP - General (Internal Medicine)  Indicate any recent Medical Services you may have received from other than [facility] providers in the past year (date may be approximate).     Assessment:   This is a routine wellness examination for Blaise.  Hearing/Vision screen Hearing Screening - Comments:: Denies hearing difficulties   Vision Screening - Comments:: - up to date with routine eye exams with  Dr Prudencio Burly  Dietary issues and exercise activities discussed: Exercise limited by: None identified   Goals Addressed               This Visit's Progress     No current goals (pt-stated)         Depression Screen    05/06/2022    8:45 AM 05/13/2021   11:39 AM 04/22/2021    1:19 PM 04/19/2020    1:19 PM 03/17/2019    9:02 AM 03/14/2018    8:12 AM  PHQ 2/9 Scores  PHQ - 2 Score 0 0 0 0 0 0  PHQ- 9 Score  0  0 0 0    Fall Risk    05/06/2022    8:47 AM 05/05/2022   10:03 AM 04/22/2021    1:18 PM 04/24/2020    8:04 AM 04/19/2020    1:19 PM  Fall Risk   Falls in the past year? 0 0 0 0 0  Number falls in past yr: 0  0 0 0  Injury with Fall? 0  0 0 0  Risk for fall due to : No Fall Risks  No Fall Risks  No Fall Risks  Follow up Falls prevention discussed  Falls prevention discussed  Falls evaluation completed;Falls prevention discussed    FALL RISK PREVENTION PERTAINING TO THE HOME:  Any stairs in or around the home? No If so, are there any without handrails? No  Home free of loose throw rugs in walkways, pet beds, electrical cords, etc? Yes  Adequate lighting in your home to reduce risk of falls? Yes   ASSISTIVE DEVICES UTILIZED TO PREVENT FALLS:  Life alert? No  Use of a cane, walker  or w/c? No  Grab bars in the bathroom? Yes  Shower chair or bench in shower? No  Elevated toilet seat or a handicapped toilet? No   TIMED UP AND GO:  Was the test performed? No . Audio Visit    Cognitive Function:    04/19/2020    1:21 PM 03/17/2019    9:05 AM  03/14/2018    8:13 AM  MMSE - Mini Mental State Exam  Orientation to time '1 5 5  '$ Orientation to Place '5 5 5  '$ Registration '3 3 3  '$ Attention/ Calculation 5 5 0  Recall '3 3 3  '$ Language- name 2 objects   0  Language- repeat '1 1 1  '$ Language- follow 3 step command   3  Language- read & follow direction   0  Write a sentence   0  Copy design   0  Total score   20        05/06/2022    8:48 AM  6CIT Screen  What Year? 0 points  What month? 0 points  What time? 0 points  Count back from 20 0 points  Months in reverse 0 points  Repeat phrase 0 points  Total Score 0 points    Immunizations Immunization History  Administered Date(s) Administered   Fluad Quad(high Dose 65+) 05/18/2019, 04/24/2020, 05/13/2021   Influenza,inj,Quad PF,6+ Mos 03/16/2018   PFIZER(Purple Top)SARS-COV-2 Vaccination 07/28/2019, 08/22/2019   Pneumococcal Conjugate-13 03/20/2019   Pneumococcal Polysaccharide-23 03/16/2018   Td 06/16/2011   Zoster Recombinat (Shingrix) 08/13/2018, 08/13/2018      Flu Vaccine status: Up to date  Pneumococcal vaccine status: Up to date  Covid-19 vaccine status: Completed vaccines  Qualifies for Shingles Vaccine? Yes   Zostavax completed Yes   Shingrix Completed?: Yes  Screening Tests Health Maintenance  Topic Date Due   HEMOGLOBIN A1C  11/10/2021   Diabetic kidney evaluation - Urine ACR  05/13/2022   OPHTHALMOLOGY EXAM  05/06/2022 (Originally 09/13/2020)   FOOT EXAM  05/07/2022 (Originally 06/28/2020)   COVID-19 Vaccine (3 - Pfizer risk series) 05/22/2022 (Originally 09/19/2019)   Zoster Vaccines- Shingrix (2 of 2) 08/06/2022 (Originally 10/08/2018)   INFLUENZA VACCINE  09/13/2022 (Originally 01/13/2022)   Diabetic kidney evaluation - GFR measurement  12/09/2022   Medicare Annual Wellness (AWV)  05/07/2023   MAMMOGRAM  08/07/2023   COLONOSCOPY (Pts 45-46yr Insurance coverage will need to be confirmed)  11/05/2026   Pneumonia Vaccine 71 Years old  Completed   DEXA  SCAN  Completed   Hepatitis C Screening  Completed   HPV VACCINES  Aged Out    Health Maintenance  Health Maintenance Due  Topic Date Due   HEMOGLOBIN A1C  11/10/2021   Diabetic kidney evaluation - Urine ACR  05/13/2022    Colorectal cancer screening: Type of screening: Colonoscopy. Completed 11/04/21. Repeat every 5 years  Mammogram status: Completed 08/06/21. Repeat every year  Bone Density status: Completed 08/20/21. Results reflect: Bone density results: OSTEOPOROSIS. Repeat every   years.  Lung Cancer Screening: (Low Dose CT Chest recommended if Age 71-80years, 30 pack-year currently smoking OR have quit w/in 15years.)  qualify.     Additional Screening:  Hepatitis C Screening: does qualify; Completed 03/14/18  Vision Screening: Recommended annual ophthalmology exams for early detection of glaucoma and other disorders of the eye. Is the patient up to date with their annual eye exam?  Yes  Who is the provider or what is the name of the office in which the patient attends  annual eye exams? Dr Prudencio Burly If pt is not established with a provider, would they like to be referred to a provider to establish care? No .   Dental Screening: Recommended annual dental exams for proper oral hygiene  Community Resource Referral / Chronic Care Management:  CRR required this visit?  No   CCM required this visit?  No      Plan:     I have personally reviewed and noted the following in the patient's chart:   Medical and social history Use of alcohol, tobacco or illicit drugs  Current medications and supplements including opioid prescriptions. Patient is not currently taking opioid prescriptions. Functional ability and status Nutritional status Physical activity Advanced directives List of other physicians Hospitalizations, surgeries, and ER visits in previous 12 months Vitals Screenings to include cognitive, depression, and falls Referrals and appointments  In addition, I have  reviewed and discussed with patient certain preventive protocols, quality metrics, and best practice recommendations. A written personalized care plan for preventive services as well as general preventive health recommendations were provided to patient.     Criselda Peaches, LPN   [id number]   Nurse Notes: Patient due Hemoglobin A1C

## 2022-05-15 ENCOUNTER — Ambulatory Visit (INDEPENDENT_AMBULATORY_CARE_PROVIDER_SITE_OTHER): Payer: PPO | Admitting: Primary Care

## 2022-05-15 ENCOUNTER — Other Ambulatory Visit: Payer: Self-pay | Admitting: Primary Care

## 2022-05-15 VITALS — BP 122/70 | HR 60 | Temp 98.0°F | Ht 58.5 in | Wt 117.4 lb

## 2022-05-15 DIAGNOSIS — E785 Hyperlipidemia, unspecified: Secondary | ICD-10-CM | POA: Diagnosis not present

## 2022-05-15 DIAGNOSIS — E1165 Type 2 diabetes mellitus with hyperglycemia: Secondary | ICD-10-CM

## 2022-05-15 DIAGNOSIS — Z23 Encounter for immunization: Secondary | ICD-10-CM | POA: Diagnosis not present

## 2022-05-15 DIAGNOSIS — K219 Gastro-esophageal reflux disease without esophagitis: Secondary | ICD-10-CM

## 2022-05-15 DIAGNOSIS — M8580 Other specified disorders of bone density and structure, unspecified site: Secondary | ICD-10-CM | POA: Diagnosis not present

## 2022-05-15 DIAGNOSIS — Z0000 Encounter for general adult medical examination without abnormal findings: Secondary | ICD-10-CM

## 2022-05-15 DIAGNOSIS — K529 Noninfective gastroenteritis and colitis, unspecified: Secondary | ICD-10-CM

## 2022-05-15 DIAGNOSIS — E278 Other specified disorders of adrenal gland: Secondary | ICD-10-CM

## 2022-05-15 DIAGNOSIS — E279 Disorder of adrenal gland, unspecified: Secondary | ICD-10-CM

## 2022-05-15 DIAGNOSIS — R7303 Prediabetes: Secondary | ICD-10-CM | POA: Diagnosis not present

## 2022-05-15 DIAGNOSIS — K8689 Other specified diseases of pancreas: Secondary | ICD-10-CM

## 2022-05-15 LAB — COMPREHENSIVE METABOLIC PANEL
ALT: 28 U/L (ref 0–35)
AST: 23 U/L (ref 0–37)
Albumin: 4.1 g/dL (ref 3.5–5.2)
Alkaline Phosphatase: 59 U/L (ref 39–117)
BUN: 12 mg/dL (ref 6–23)
CO2: 31 mEq/L (ref 19–32)
Calcium: 9.3 mg/dL (ref 8.4–10.5)
Chloride: 102 mEq/L (ref 96–112)
Creatinine, Ser: 0.77 mg/dL (ref 0.40–1.20)
GFR: 77.58 mL/min (ref >60.00)
Glucose, Bld: 98 mg/dL (ref 70–99)
Potassium: 4.4 mEq/L (ref 3.5–5.1)
Sodium: 139 mEq/L (ref 135–145)
Total Bilirubin: 0.8 mg/dL (ref 0.2–1.2)
Total Protein: 6.6 g/dL (ref 6.0–8.3)

## 2022-05-15 LAB — LIPID PANEL
Cholesterol: 129 mg/dL (ref 0–200)
HDL: 49.6 mg/dL (ref >39.00)
LDL Cholesterol: 65 mg/dL (ref 0–99)
NonHDL: 79.57
Total CHOL/HDL Ratio: 3
Triglycerides: 75 mg/dL (ref 0.0–149.0)
VLDL: 15 mg/dL (ref 0.0–40.0)

## 2022-05-15 LAB — HEMOGLOBIN A1C: Hgb A1c MFr Bld: 6.5 % (ref 4.6–6.5)

## 2022-05-15 NOTE — Assessment & Plan Note (Addendum)
Reviewed bone density scan from 2023.  Continue alendronate 70 mg weekly through May 2024 which will be 5 years of treatment.  Discontinue after May 2024.  Repeat bone density scan in 2025.

## 2022-05-15 NOTE — Patient Instructions (Signed)
Discontinue alendronate (Fosamax) bone density medication after May 2024.  Try taking famotidine (Pepcid) 20 mg daily for heartburn for 1 month, then try to stop.   Stop by the lab prior to leaving today. I will notify you of your results once received.   It was a pleasure to see you today!  Preventive Care 27 Years and Older, Female Preventive care refers to lifestyle choices and visits with your health care provider that can promote health and wellness. Preventive care visits are also called wellness exams. What can I expect for my preventive care visit? Counseling Your health care provider may ask you questions about your: Medical history, including: Past medical problems. Family medical history. Pregnancy and menstrual history. History of falls. Current health, including: Memory and ability to understand (cognition). Emotional well-being. Home life and relationship well-being. Sexual activity and sexual health. Lifestyle, including: Alcohol, nicotine or tobacco, and drug use. Access to firearms. Diet, exercise, and sleep habits. Work and work Statistician. Sunscreen use. Safety issues such as seatbelt and bike helmet use. Physical exam Your health care provider will check your: Height and weight. These may be used to calculate your BMI (body mass index). BMI is a measurement that tells if you are at a healthy weight. Waist circumference. This measures the distance around your waistline. This measurement also tells if you are at a healthy weight and may help predict your risk of certain diseases, such as type 2 diabetes and high blood pressure. Heart rate and blood pressure. Body temperature. Skin for abnormal spots. What immunizations do I need?  Vaccines are usually given at various ages, according to a schedule. Your health care provider will recommend vaccines for you based on your age, medical history, and lifestyle or other factors, such as travel or where you  work. What tests do I need? Screening Your health care provider may recommend screening tests for certain conditions. This may include: Lipid and cholesterol levels. Hepatitis C test. Hepatitis B test. HIV (human immunodeficiency virus) test. STI (sexually transmitted infection) testing, if you are at risk. Lung cancer screening. Colorectal cancer screening. Diabetes screening. This is done by checking your blood sugar (glucose) after you have not eaten for a while (fasting). Mammogram. Talk with your health care provider about how often you should have regular mammograms. BRCA-related cancer screening. This may be done if you have a family history of breast, ovarian, tubal, or peritoneal cancers. Bone density scan. This is done to screen for osteoporosis. Talk with your health care provider about your test results, treatment options, and if necessary, the need for more tests. Follow these instructions at home: Eating and drinking  Eat a diet that includes fresh fruits and vegetables, whole grains, lean protein, and low-fat dairy products. Limit your intake of foods with high amounts of sugar, saturated fats, and salt. Take vitamin and mineral supplements as recommended by your health care provider. Do not drink alcohol if your health care provider tells you not to drink. If you drink alcohol: Limit how much you have to 0-1 drink a day. Know how much alcohol is in your drink. In the [place] one drink equals one 12 oz bottle of beer (355 mL), one 5 oz glass of wine (148 mL), or one 1 oz glass of hard liquor (44 mL). Lifestyle Brush your teeth every morning and night with fluoride toothpaste. Floss one time each day. Exercise for at least 30 minutes 5 or more days each week. Do not use any products that contain  nicotine or tobacco. These products include cigarettes, chewing tobacco, and vaping devices, such as e-cigarettes. If you need help quitting, ask your health care provider. Do not  use drugs. If you are sexually active, practice safe sex. Use a condom or other form of protection in order to prevent STIs. Take aspirin only as told by your health care provider. Make sure that you understand how much to take and what form to take. Work with your health care provider to find out whether it is safe and beneficial for you to take aspirin daily. Ask your health care provider if you need to take a cholesterol-lowering medicine (statin). Find healthy ways to manage stress, such as: Meditation, yoga, or listening to music. Journaling. Talking to a trusted person. Spending time with friends and family. Minimize exposure to UV radiation to reduce your risk of skin cancer. Safety Always wear your seat belt while driving or riding in a vehicle. Do not drive: If you have been drinking alcohol. Do not ride with someone who has been drinking. When you are tired or distracted. While texting. If you have been using any mind-altering substances or drugs. Wear a helmet and other protective equipment during sports activities. If you have firearms in your house, make sure you follow all gun safety procedures. What's next? Visit your health care provider once a year for an annual wellness visit. Ask your health care provider how often you should have your eyes and teeth checked. Stay up to date on all vaccines. This information is not intended to replace advice given to you by your health care provider. Make sure you discuss any questions you have with your health care provider. Document Revised: 11/27/2020 Document Reviewed: 11/27/2020 Elsevier Patient Education  [place].

## 2022-05-15 NOTE — Addendum Note (Signed)
Addended by: Francella Solian on: 05/15/2022 02:33 PM   Modules accepted: Orders

## 2022-05-15 NOTE — Assessment & Plan Note (Signed)
CT abdomen/pelvis from September 2023 with benign findings. No follow up needed.

## 2022-05-15 NOTE — Assessment & Plan Note (Signed)
Immunizations UTD. Influenza vaccine provided today. Mammogram and bone density scan UTD. Colonoscopy UTD, due 2028  Discussed the importance of a healthy diet and regular exercise in order for weight loss, and to reduce the risk of further co-morbidity.  Exam stable. Labs pending.  Follow up in 1 year for repeat physical.

## 2022-05-15 NOTE — Assessment & Plan Note (Addendum)
Repeat lipid panel pending.  Continue atorvastatin 40 mg daily  Commended her on a healthy lifestyle.

## 2022-05-15 NOTE — Assessment & Plan Note (Signed)
Recent A1c level of 6.5. Remain off of metformin for now per patient request.  Repeat A1C in 3 months.

## 2022-05-15 NOTE — Assessment & Plan Note (Signed)
Controlled.  Will try to wean off. Start famotidine 20 mg daily x 1 month with plans to discontinue.

## 2022-05-15 NOTE — Assessment & Plan Note (Signed)
Resolved

## 2022-05-15 NOTE — Progress Notes (Signed)
Subjective:    Patient ID: Kayla Ritter, female    DOB: 1951-01-30, 71 y.o.   MRN: [id number]  HPI  Kayla Ritter is a very pleasant 71 y.o. female who presents today for complete physical and follow up of chronic conditions.  Immunizations: -Tetanus: 2013 -Influenza: Due today -Shingles: Completed Shingrix series -Pneumonia: Prevnar 13 in 2020, pneumovax in 2019  Diet: Healthy diet.  Exercise: No regular exercise, some walking.  Eye exam: Completes annually  Dental exam: Completes semi-annually   Mammogram: Completed in February 2023  Colonoscopy: Completed in 2023, due 2028 Dexa: Completed in March 2023  BP Readings from Last 3 Encounters:  05/15/22 122/70  04/09/22 118/64  02/06/22 128/78        Review of Systems  Constitutional:  Negative for unexpected weight change.  HENT:  Negative for rhinorrhea.   Respiratory:  Negative for cough and shortness of breath.   Cardiovascular:  Negative for chest pain.  Gastrointestinal:  Negative for constipation and diarrhea.  Genitourinary:  Negative for difficulty urinating.  Musculoskeletal:  Negative for arthralgias and myalgias.  Skin:  Negative for rash.  Allergic/Immunologic: Negative for environmental allergies.  Neurological:  Negative for dizziness and headaches.  Psychiatric/Behavioral:  The patient is not nervous/anxious.          Past Medical History:  Diagnosis Date   Chickenpox    Genital warts    GERD (gastroesophageal reflux disease)    Hyperlipidemia    Urinary tract infection     Social History   Socioeconomic History   Marital status: Married    Spouse name: Not on file   Number of children: Not on file   Years of education: Not on file   Highest education level: Not on file  Occupational History   Not on file  Tobacco Use   Smoking status: Former    Types: Cigarettes    Quit date: 06/15/1978    Years since quitting: 43.9   Smokeless tobacco: Never   Tobacco comments:     Stopping smoking 1980  Vaping Use   Vaping Use: Never used  Substance and Sexual Activity   Alcohol use: Yes    Comment: 1 glass of wine per month   Drug use: Not Currently   Sexual activity: Not on file  Other Topics Concern   Not on file  Social History Narrative   ** Merged History Encounter **       Married. 4 children, 5 grandchildren.  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  Enjoys reading, crossword puzzles, spending time with grandchildren.    Social Determinants of Health   Financial Resource Strain: Low Risk  (05/06/2022)   Overall Financial Resource Strain (CARDIA)    Difficulty of Paying Living Expenses: Not hard at all  Food Insecurity: No Food Insecurity (05/06/2022)   Hunger Vital Sign    Worried About Running Out of Food in the Last Year: Never true    Ran Out of Food in the Last Year: Never true  Transportation Needs: No Transportation Needs (04/22/2021)   PRAPARE - Hydrologist (Medical): No    Lack of Transportation (Non-Medical): No  Physical Activity: Insufficiently Active (05/06/2022)   Exercise Vital Sign    Days of Exercise per Week: 3 days    Minutes of Exercise per Session: 40 min  Stress: No Stress Concern Present (05/06/2022)   [place]  Feeling of Stress : Not at all  Social Connections: Socially Integrated (05/06/2022)   Social Connection and Isolation Panel [NHANES]    Frequency of Communication with Friends and Family: More than three times a week    Frequency of Social Gatherings with Friends and Family: More than three times a week    Attends Religious Services: More than 4 times per year    Active Member of Clubs or Organizations: Yes    Attends Archivist Meetings: More than 4 times per year    Marital Status: Married  Human resources officer Violence: Not At Risk (05/06/2022)   Humiliation, Afraid, Rape, and  Kick questionnaire    Fear of Current or Ex-Partner: No    Emotionally Abused: No    Physically Abused: No    Sexually Abused: No    Past Surgical History:  Procedure Laterality Date   CATARACT EXTRACTION Right 08/29/2019   CATARACT EXTRACTION Left 09/19/2019   COLONOSCOPY WITH ESOPHAGOGASTRODUODENOSCOPY (EGD)     COLONOSCOPY WITH PROPOFOL N/A 11/04/2021   Procedure: COLONOSCOPY WITH PROPOFOL;  Surgeon: Lin Landsman, MD;  Location: [facility];  Service: Gastroenterology;  Laterality: N/A;   ESOPHAGOGASTRODUODENOSCOPY (EGD) WITH PROPOFOL N/A 11/04/2021   Procedure: ESOPHAGOGASTRODUODENOSCOPY (EGD) WITH PROPOFOL;  Surgeon: Lin Landsman, MD;  Location[facility] ENDOSCOPY;  Service: Gastroenterology;  Laterality: N/A;    Family History  Problem Relation Age of Onset   Arthritis Mother    Diabetes Mother    Hearing loss Mother    Hypertension Mother    Kidney cancer Mother    Hearing loss Father    Stomach cancer Father    Asthma Brother    Heart Problems Brother    Breast cancer Neg Hx     Allergies  Allergen Reactions   Codeine     REACTION: N' \\T'$ \ V    Current Outpatient Medications on File Prior to Visit  Medication Sig Dispense Refill   alendronate (FOSAMAX) 70 MG tablet Take 1 tablet by mouth once weekly on an empty stomach with water for bone density. Do not lay down within 1 hour after taking. 12 tablet 3   aspirin EC 81 MG tablet Take 1 tablet (81 mg total) by mouth daily. Swallow whole.     atorvastatin (LIPITOR) 40 MG tablet Take 1 tablet (40 mg total) by mouth daily. For cholesterol. 90 tablet 3   CALCIUM PO Take 1,000 mg by mouth daily.     Cholecalciferol (VITAMIN D PO) Take 1,000 Units by mouth daily.     MAGNESIUM PO Take by mouth daily.     omeprazole (PRILOSEC OTC) 20 MG tablet Take 20 mg by mouth daily.     No current facility-administered medications on file prior to visit.    BP 122/70   Pulse 60   Temp 98 F (36.7 C) (Oral)   Ht 4'  10.5" (1.486 m)   Wt 117 lb 6.4 oz (53.3 kg)   SpO2 99%   BMI 24.12 kg/m  Objective:   Physical Exam HENT:     Right Ear: Tympanic membrane and ear canal normal.     Left Ear: Tympanic membrane and ear canal normal.     Nose: Nose normal.  Eyes:     Conjunctiva/sclera: Conjunctivae normal.     Pupils: Pupils are equal, round, and reactive to light.  Neck:     Thyroid: No thyromegaly.  Cardiovascular:     Rate and Rhythm: Normal rate and regular rhythm.  Heart sounds: No murmur heard. Pulmonary:     Effort: Pulmonary effort is normal.     Breath sounds: Normal breath sounds. No rales.  Abdominal:     General: Bowel sounds are normal.     Palpations: Abdomen is soft.     Tenderness: There is no abdominal tenderness.  Musculoskeletal:        General: Normal range of motion.     Cervical back: Neck supple.  Lymphadenopathy:     Cervical: No cervical adenopathy.  Skin:    General: Skin is warm and dry.     Findings: No rash.  Neurological:     Mental Status: She is alert and oriented to person, place, and time.     Cranial Nerves: No cranial nerve deficit.     Deep Tendon Reflexes: Reflexes are normal and symmetric.  Psychiatric:        Mood and Affect: Mood normal.           Assessment & Plan:   Problem List Items Addressed This Visit       Digestive   GERD    Controlled.  Will try to wean off. Start famotidine 20 mg daily x 1 month with plans to discontinue.       Chronic diarrhea    Resolved.        Pancreatic insufficiency    Symptoms significantly improved.  Reviewed labs from June 2023. Repeat lab pending per patient request.       Relevant Orders   Pancreatic elastase, fecal     Musculoskeletal and Integument   Osteopenia    Reviewed bone density scan from 2023.  Continue alendronate 70 mg weekly through May 2024 which will be 5 years of treatment.  Discontinue after May 2024.  Repeat bone density scan in 2025.        Other    Hyperlipidemia    Repeat lipid panel pending.  Continue atorvastatin 40 mg daily  Commended her on a healthy lifestyle.       Relevant Orders   Lipid panel   Comprehensive metabolic panel   Preventative health care - Primary    Immunizations UTD. Influenza vaccine provided today. Mammogram and bone density scan UTD. Colonoscopy UTD, due 2028  Discussed the importance of a healthy diet and regular exercise in order for weight loss, and to reduce the risk of further co-morbidity.  Exam stable. Labs pending.  Follow up in 1 year for repeat physical.       Adrenal nodule ([place])    CT abdomen/pelvis from September 2023 with benign findings. No follow up needed.      Prediabetes   Relevant Orders   Hemoglobin A1c   Other Visit Diagnoses     Need for immunization against influenza       Relevant Orders   Flu Vaccine QUAD High Dose(Fluad) (Completed)          Pleas Koch, NP

## 2022-05-15 NOTE — Assessment & Plan Note (Signed)
Symptoms significantly improved.  Reviewed labs from June 2023. Repeat lab pending per patient request.

## 2022-06-24 ENCOUNTER — Other Ambulatory Visit: Payer: PPO

## 2022-06-24 DIAGNOSIS — K8689 Other specified diseases of pancreas: Secondary | ICD-10-CM

## 2022-07-01 LAB — PANCREATIC ELASTASE, FECAL: Pancreatic Elastase-1, Stool: 81 mcg/g — ABNORMAL LOW

## 2022-07-22 ENCOUNTER — Other Ambulatory Visit: Payer: Self-pay | Admitting: Primary Care

## 2022-07-22 DIAGNOSIS — E785 Hyperlipidemia, unspecified: Secondary | ICD-10-CM

## 2022-08-31 DIAGNOSIS — H401231 Low-tension glaucoma, bilateral, mild stage: Secondary | ICD-10-CM | POA: Diagnosis not present

## 2022-08-31 DIAGNOSIS — Z961 Presence of intraocular lens: Secondary | ICD-10-CM | POA: Diagnosis not present

## 2022-09-15 DIAGNOSIS — L814 Other melanin hyperpigmentation: Secondary | ICD-10-CM | POA: Diagnosis not present

## 2022-09-15 DIAGNOSIS — Z08 Encounter for follow-up examination after completed treatment for malignant neoplasm: Secondary | ICD-10-CM | POA: Diagnosis not present

## 2022-09-15 DIAGNOSIS — Z85828 Personal history of other malignant neoplasm of skin: Secondary | ICD-10-CM | POA: Diagnosis not present

## 2022-09-15 DIAGNOSIS — L821 Other seborrheic keratosis: Secondary | ICD-10-CM | POA: Diagnosis not present

## 2022-09-15 DIAGNOSIS — D485 Neoplasm of uncertain behavior of skin: Secondary | ICD-10-CM | POA: Diagnosis not present

## 2022-09-15 DIAGNOSIS — L820 Inflamed seborrheic keratosis: Secondary | ICD-10-CM | POA: Diagnosis not present

## 2022-09-15 DIAGNOSIS — D225 Melanocytic nevi of trunk: Secondary | ICD-10-CM | POA: Diagnosis not present

## 2022-11-29 NOTE — Progress Notes (Unsigned)
    Nesha Counihan T. Alynah Schone, MD, CAQ Sports Medicine [facility] [address] [place], [address]  Phone: [phone]  FAX: [phone]  Kayla Ritter - 72 y.o. female  MRN [id number]  Date of Birth: [birth year]  Date: 11/30/2022  PCP: Doreene Nest, NP  Referral: Doreene Nest, NP  No chief complaint on file.  Subjective:   Kayla Ritter is a 72 y.o. very pleasant female patient with There is no height or weight on file to calculate BMI. who presents with the following:  The patient presents with neck, hip, and back pain.  I did see her before last in March 2023 for some cervical radiculopathy.  She had a prior MRI of her cervical spine in 2022.  She did have multilevel moderate degenerative disc disease of the cervical spine.  She also had spinal stenosis and severe neuroforaminal stenosis at multiple levels.  CT of the abdomen and pelvis dated Nov 06, 2021, is independently reviewed by myself and the bone windows.  She does have mild multilevel degenerative disc disease, but in the lowest part of the spine it is not particularly bad.  It is more moderate in character in the visualized thoracic spine.    Review of Systems is noted in the HPI, as appropriate  Objective:   There were no vitals taken for this visit.  GEN: No acute distress; alert,appropriate. PULM: Breathing comfortably in no respiratory distress PSYCH: Normally interactive.   Laboratory and Imaging Data:  Assessment and Plan:   ***

## 2022-11-30 ENCOUNTER — Ambulatory Visit (INDEPENDENT_AMBULATORY_CARE_PROVIDER_SITE_OTHER): Payer: PPO | Admitting: Family Medicine

## 2022-11-30 ENCOUNTER — Encounter: Payer: Self-pay | Admitting: Family Medicine

## 2022-11-30 VITALS — BP 112/70 | HR 60 | Temp 97.6°F | Ht 58.5 in | Wt 119.2 lb

## 2022-11-30 DIAGNOSIS — M5030 Other cervical disc degeneration, unspecified cervical region: Secondary | ICD-10-CM | POA: Diagnosis not present

## 2022-11-30 DIAGNOSIS — M5412 Radiculopathy, cervical region: Secondary | ICD-10-CM

## 2022-11-30 DIAGNOSIS — M549 Dorsalgia, unspecified: Secondary | ICD-10-CM | POA: Diagnosis not present

## 2022-11-30 DIAGNOSIS — G8929 Other chronic pain: Secondary | ICD-10-CM | POA: Diagnosis not present

## 2022-11-30 DIAGNOSIS — M542 Cervicalgia: Secondary | ICD-10-CM

## 2022-11-30 MED ORDER — DICLOFENAC SODIUM 75 MG PO TBEC: 75.0000 mg | DELAYED_RELEASE_TABLET | Freq: Two times a day (BID) | ORAL | 1 refills | Status: DC

## 2022-12-03 DIAGNOSIS — L538 Other specified erythematous conditions: Secondary | ICD-10-CM | POA: Diagnosis not present

## 2022-12-03 DIAGNOSIS — D485 Neoplasm of uncertain behavior of skin: Secondary | ICD-10-CM | POA: Diagnosis not present

## 2022-12-03 DIAGNOSIS — L821 Other seborrheic keratosis: Secondary | ICD-10-CM | POA: Diagnosis not present

## 2023-01-18 ENCOUNTER — Telehealth: Payer: Self-pay | Admitting: Pharmacist

## 2023-01-18 NOTE — Progress Notes (Unsigned)
Pharmacy Quality Measure Review  This patient is appearing on a report for being at risk of failing the adherence measure for cholesterol (statin) medications this calendar year.   Medication: atorvastatin 40 mg Last fill date: 4/24 for 90 day supply  Contacted patient to discuss refill. Left voicemail. Will send MyChart message.   Catie Eppie Gibson, PharmD, BCACP, CPP Clinical Pharmacist [facility] [phone]

## 2023-01-26 DIAGNOSIS — D485 Neoplasm of uncertain behavior of skin: Secondary | ICD-10-CM | POA: Diagnosis not present

## 2023-01-26 DIAGNOSIS — L538 Other specified erythematous conditions: Secondary | ICD-10-CM | POA: Diagnosis not present

## 2023-01-26 DIAGNOSIS — L298 Other pruritus: Secondary | ICD-10-CM | POA: Diagnosis not present

## 2023-01-26 DIAGNOSIS — L820 Inflamed seborrheic keratosis: Secondary | ICD-10-CM | POA: Diagnosis not present

## 2023-02-18 IMAGING — DX DG SHOULDER 2+V*L*
3 series · 3 of 3 positions shown · non-contrast
Comparison: None.

CLINICAL DATA: Acute left shoulder pain after fall 1 month ago.

EXAM:
LEFT SHOULDER - 2+ VIEW

[shoulder axial]
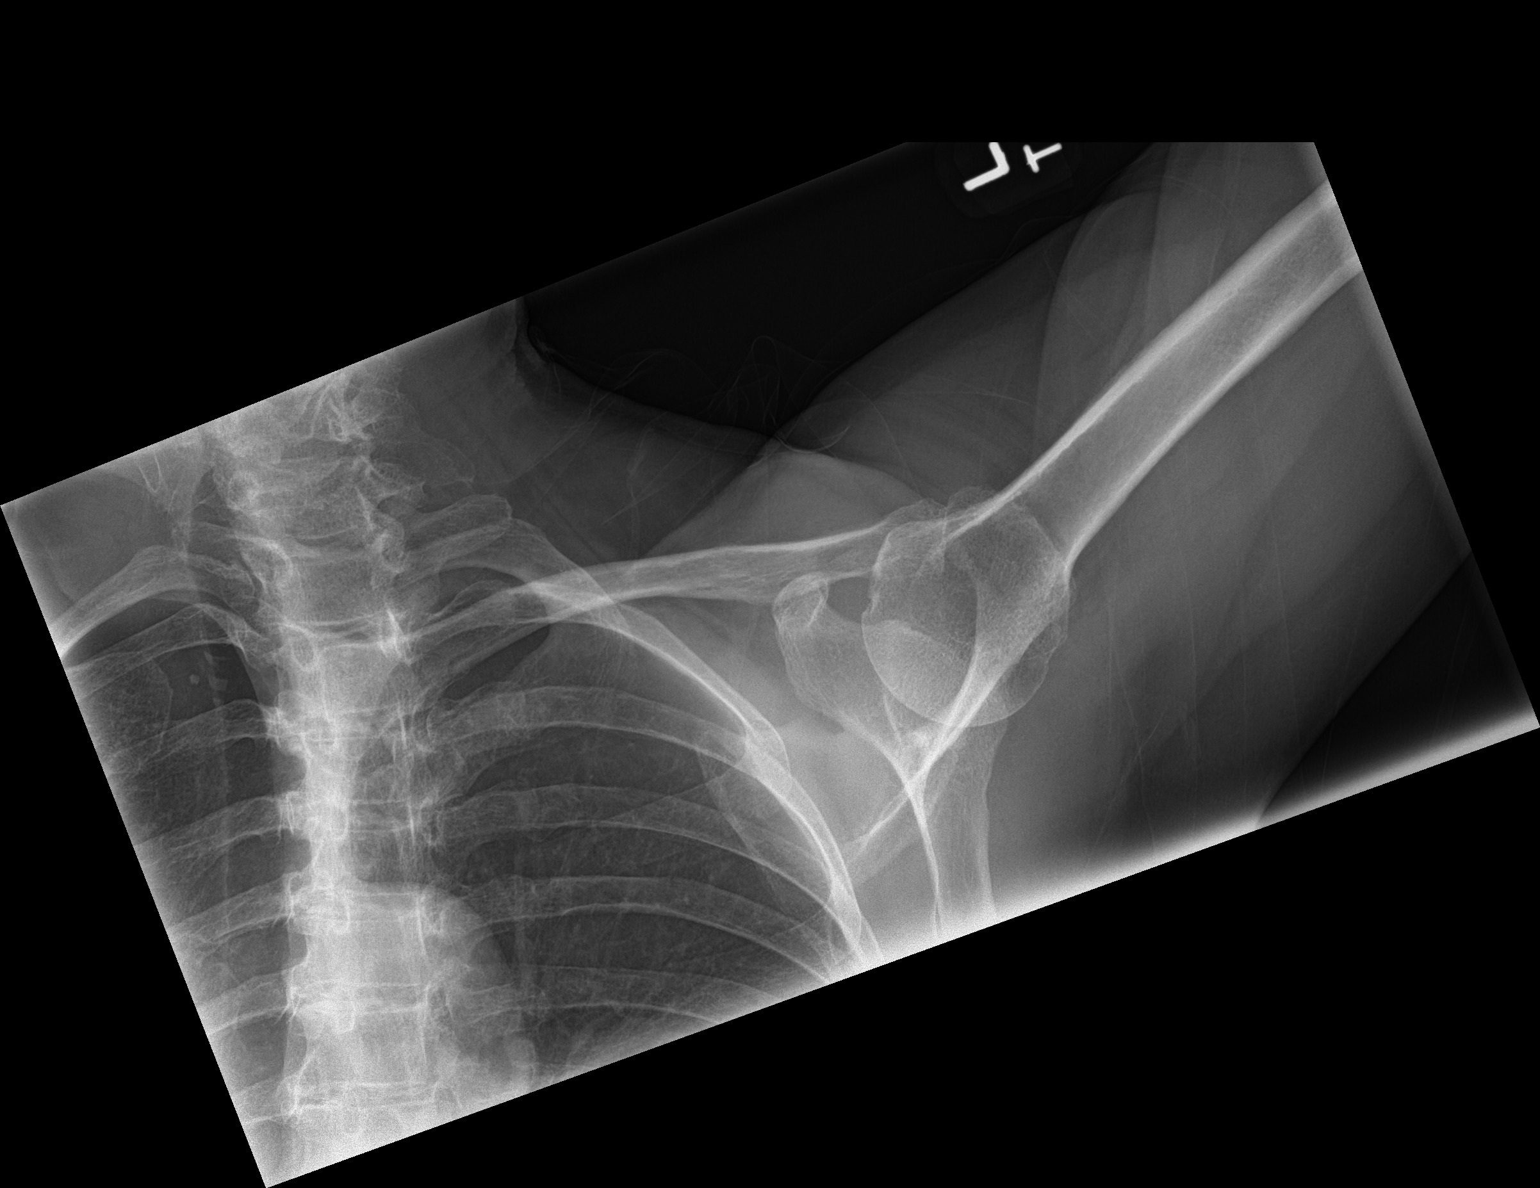

[shoulder obl]
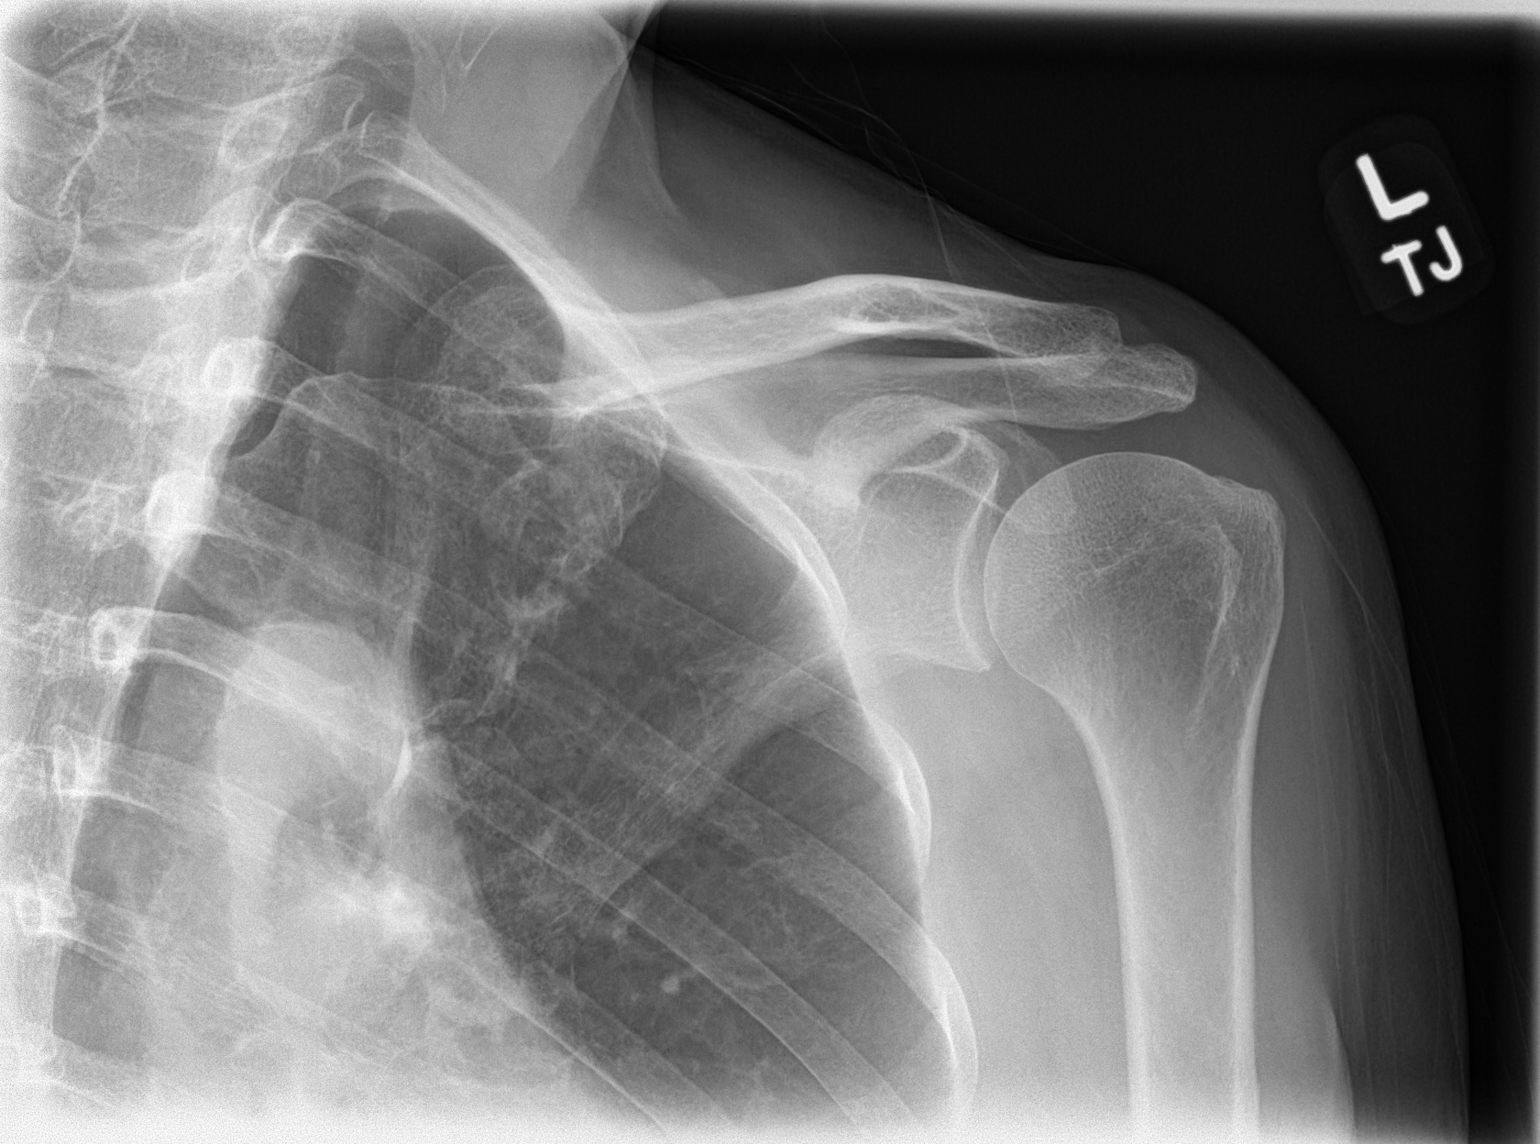

[shoulder y-view]
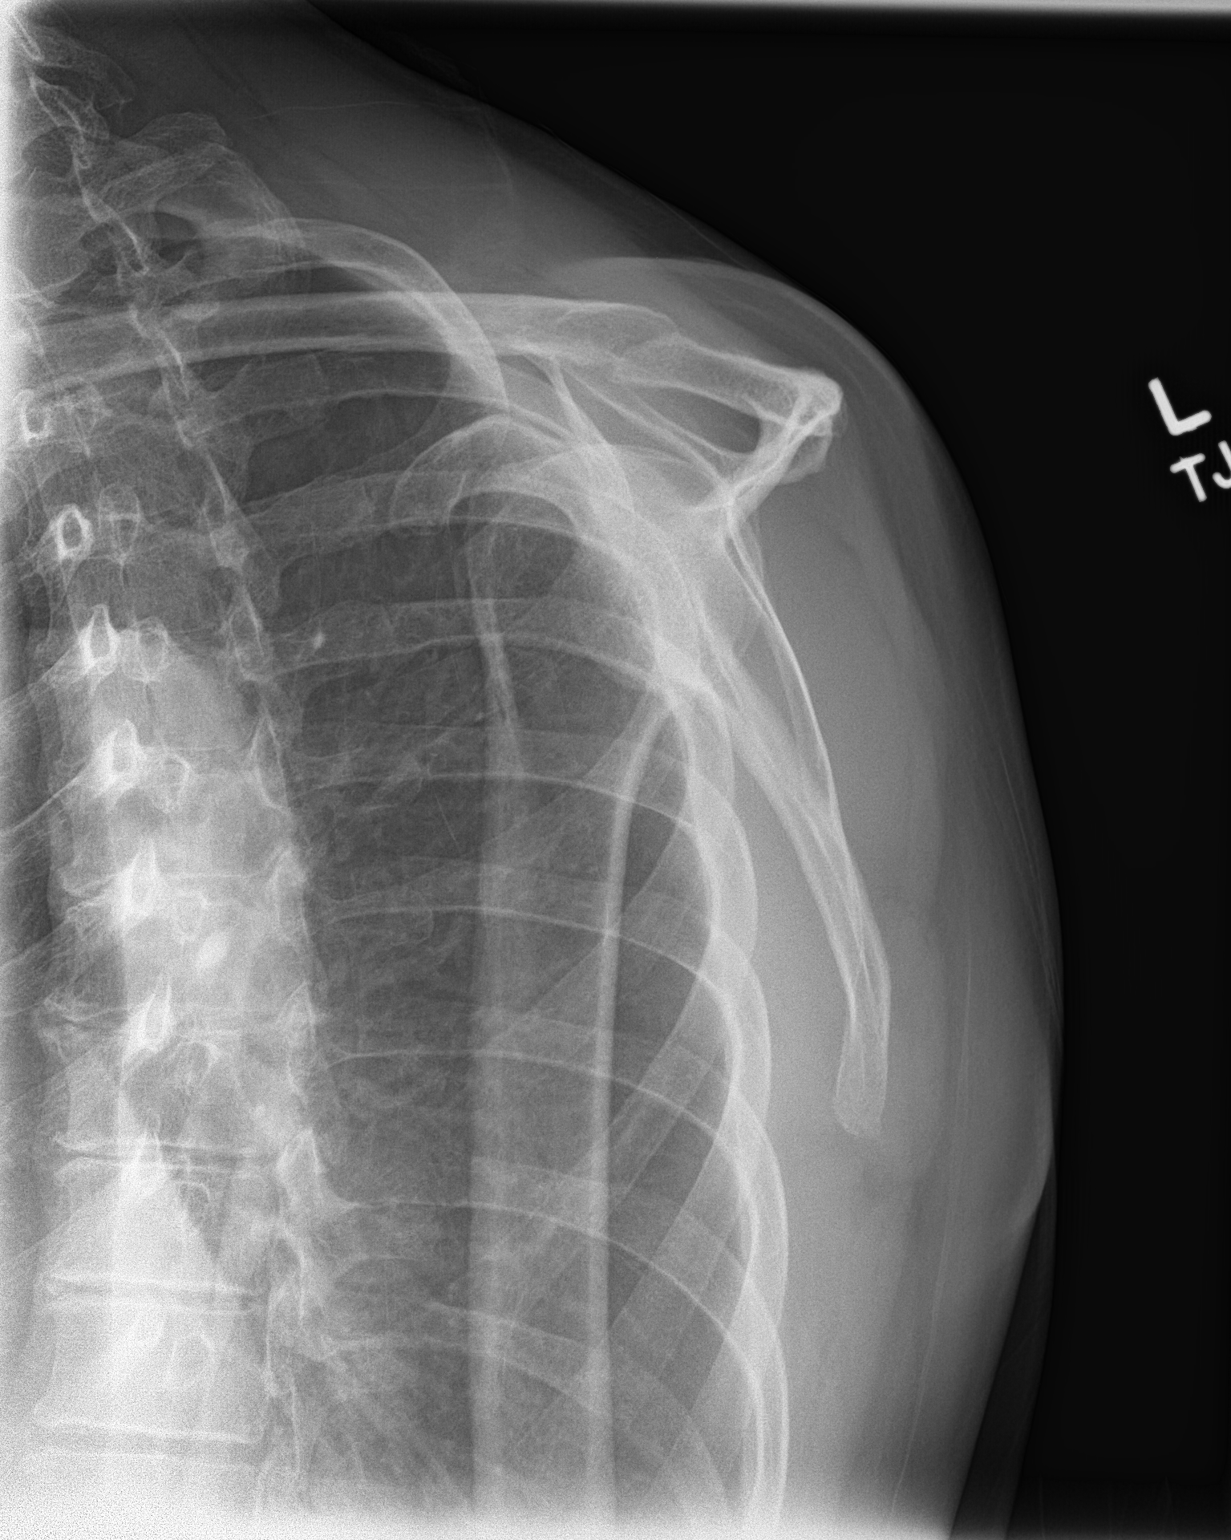

[3 of 3 positions shown; findings below may reference images not displayed]

FINDINGS: There is no evidence of fracture or dislocation. There is no
evidence of arthropathy or other focal bone abnormality. Soft
tissues are unremarkable.
IMPRESSION: Negative.

## 2023-03-06 IMAGING — CR DG CHEST 2V
2 series · 2 of 2 positions shown · non-contrast
Comparison: 04/11/2007

CLINICAL DATA: Chest pain and back pain.

EXAM:
CHEST - 2 VIEW

[chest pa]
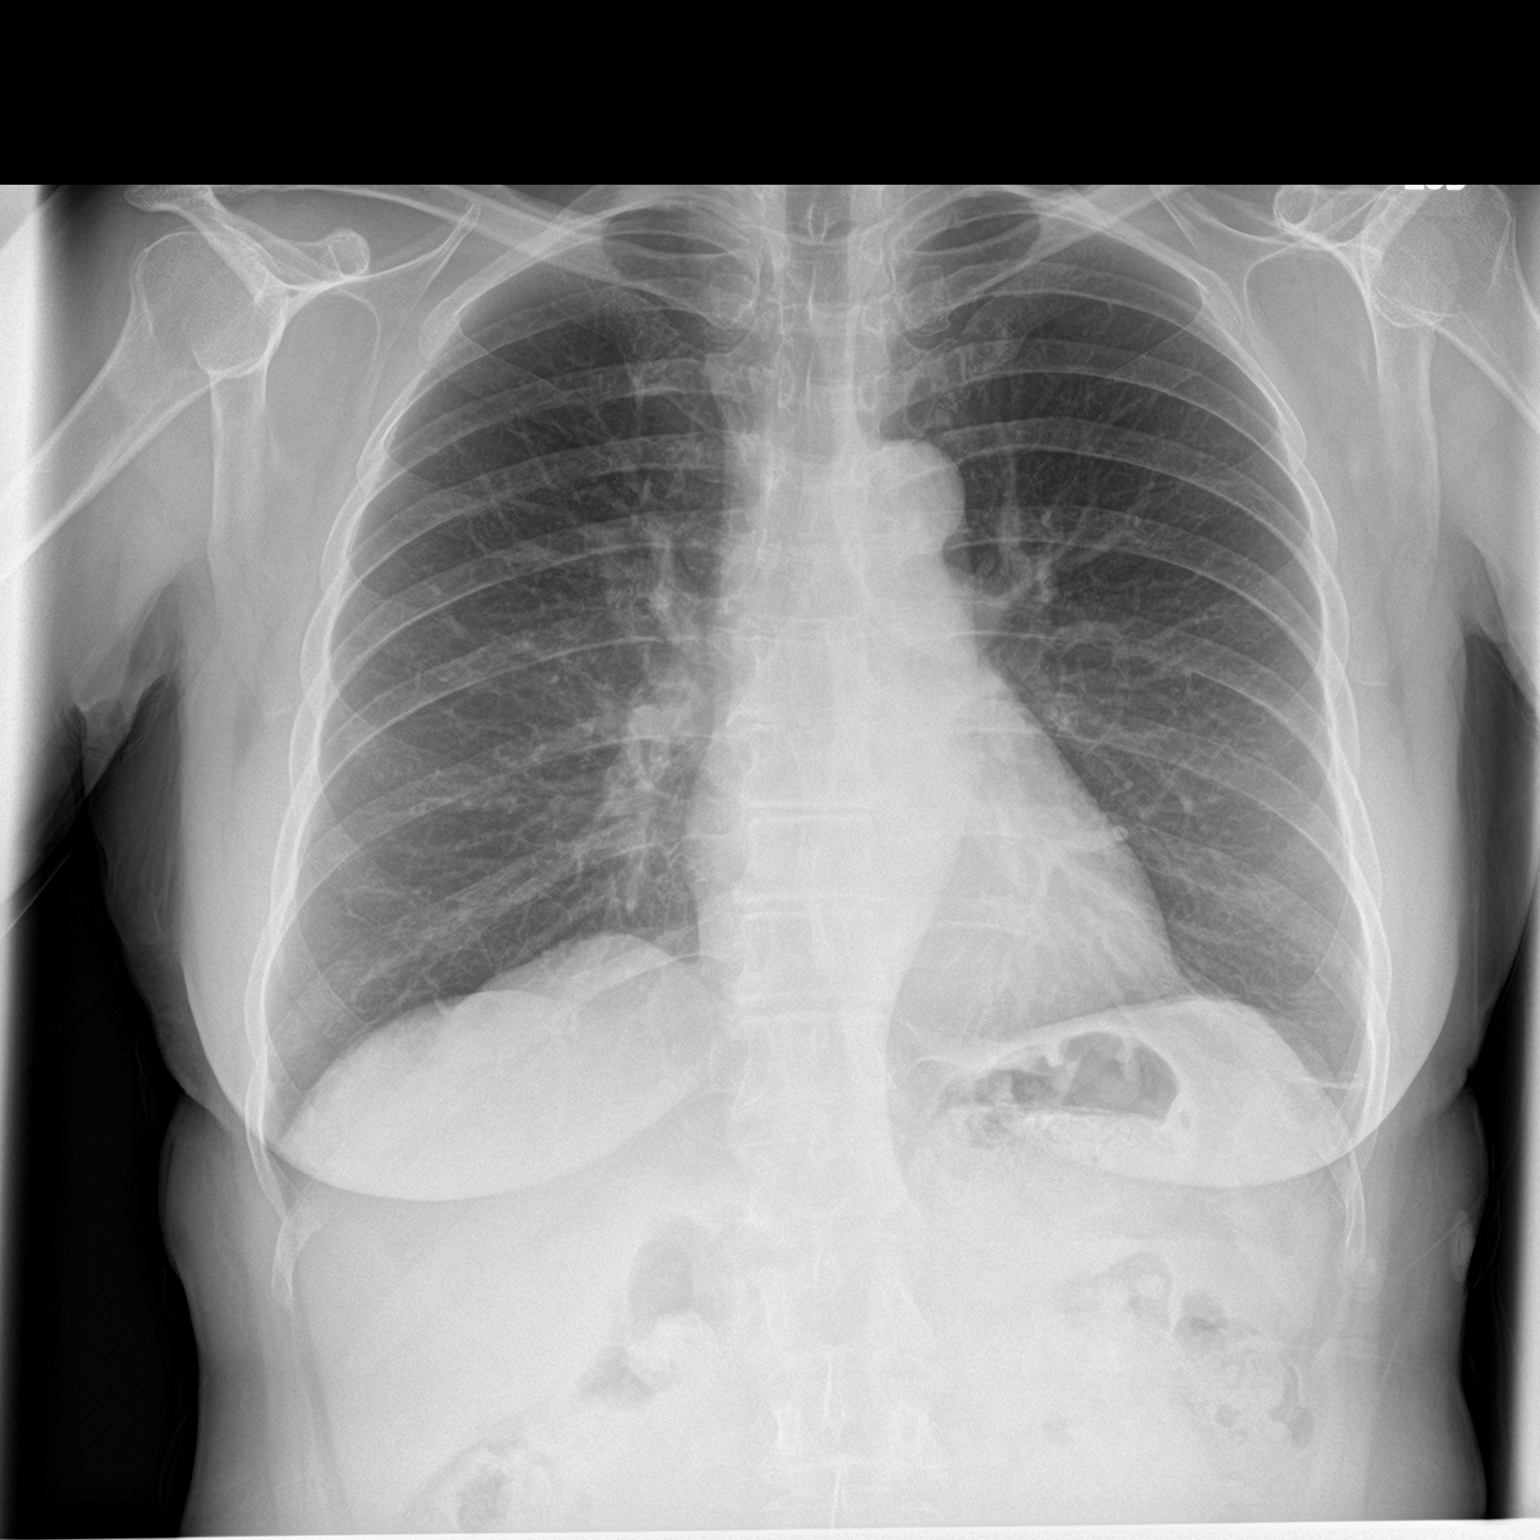

[chest lat]
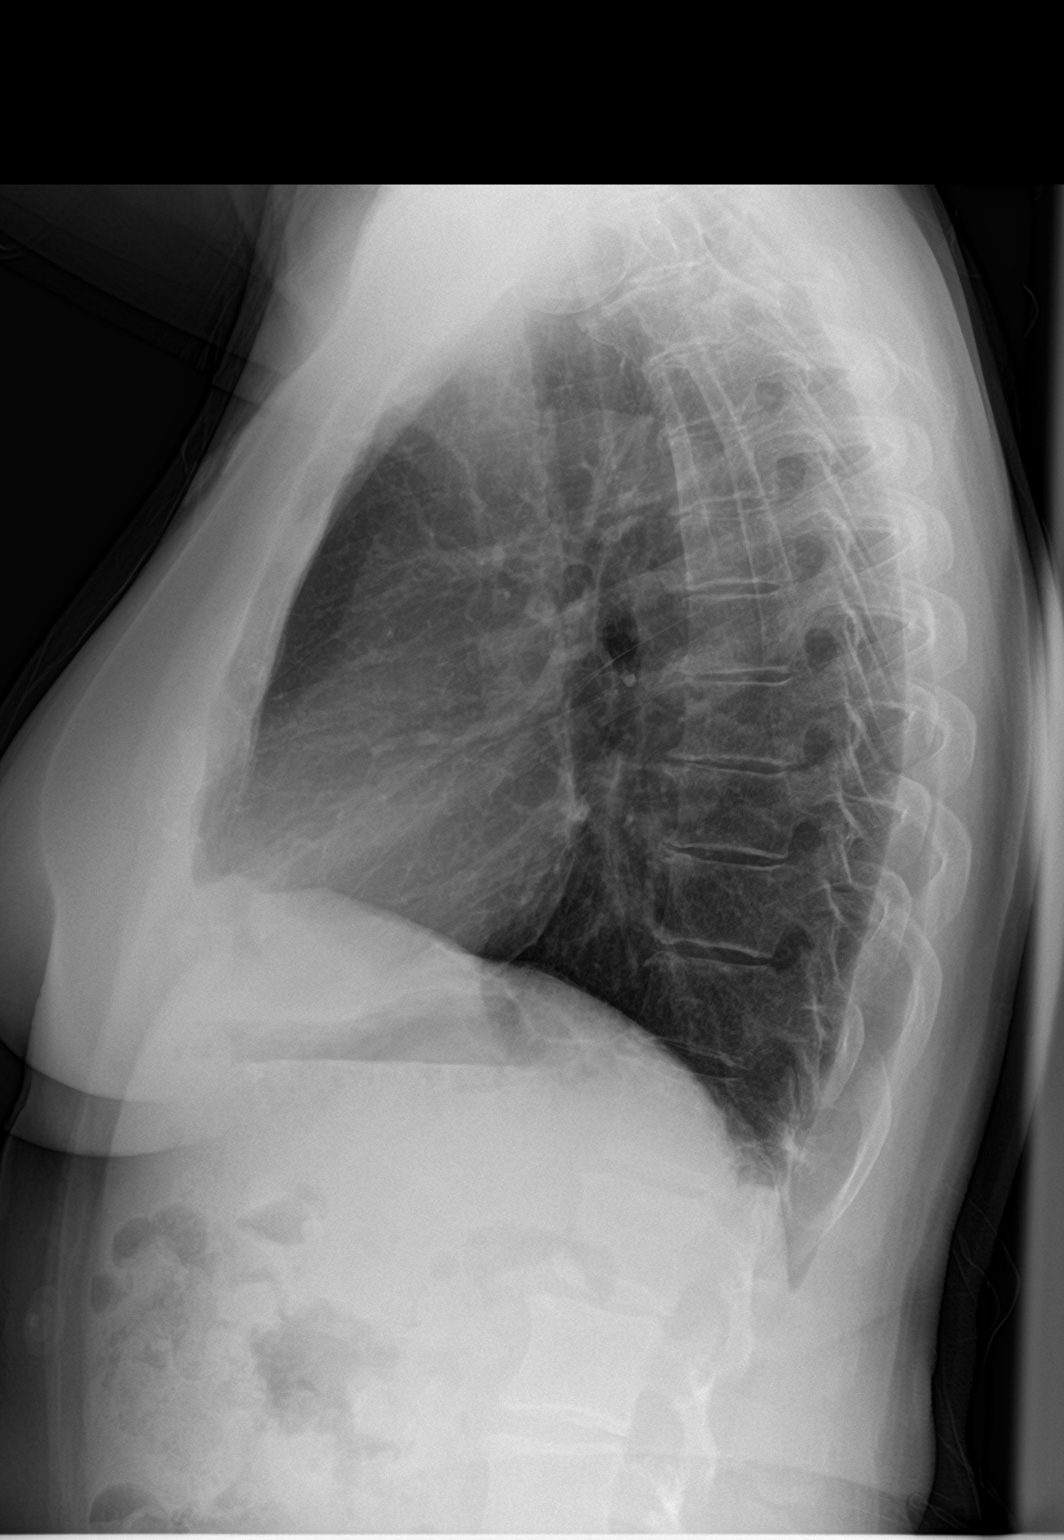

[2 of 2 positions shown; findings below may reference images not displayed]

FINDINGS: The heart size and mediastinal contours are within normal limits.
Both lungs are clear. The visualized skeletal structures are
unremarkable. Mild linear atelectasis in the left lung base.
IMPRESSION: No active cardiopulmonary disease.

## 2023-05-03 ENCOUNTER — Other Ambulatory Visit: Payer: Self-pay | Admitting: Primary Care

## 2023-05-03 DIAGNOSIS — E785 Hyperlipidemia, unspecified: Secondary | ICD-10-CM

## 2023-05-03 NOTE — Telephone Encounter (Signed)
Has AWV 05/18/23

## 2023-05-10 ENCOUNTER — Ambulatory Visit (INDEPENDENT_AMBULATORY_CARE_PROVIDER_SITE_OTHER): Payer: PPO

## 2023-05-10 VITALS — Ht 58.5 in | Wt 119.0 lb

## 2023-05-10 DIAGNOSIS — Z0000 Encounter for general adult medical examination without abnormal findings: Secondary | ICD-10-CM | POA: Diagnosis not present

## 2023-05-10 DIAGNOSIS — E119 Type 2 diabetes mellitus without complications: Secondary | ICD-10-CM | POA: Diagnosis not present

## 2023-05-10 NOTE — Addendum Note (Signed)
Addended by: Vincenza Hews on: 05/10/2023 02:12 PM   Modules accepted: Orders

## 2023-05-10 NOTE — Progress Notes (Signed)
Subjective:   Kayla Ritter is a 71 y.o. female who presents for Medicare Annual (Subsequent) preventive examination.  Visit Complete: Virtual I connected with  Roe Rutherford on 05/10/23 by a audio enabled telemedicine application and verified that I am speaking with the correct person using two identifiers.  Patient Location: Home  Provider Location: Home Office  I discussed the limitations of evaluation and management by telemedicine. The patient expressed understanding and agreed to proceed.  Vital Signs: Because this visit was a virtual/telehealth visit, some criteria may be missing or patient reported. Any vitals not documented were not able to be obtained and vitals that have been documented are patient reported.  Patient Medicare AWV questionnaire was completed by the patient on 05/10/23; I have confirmed that all information answered by patient is correct and no changes since this date.  Cardiac Risk Factors include: advanced age (>62men, >55 women);dyslipidemia;diabetes mellitus     Objective:    Today's Vitals   05/10/23 1336  Weight: 119 lb (54 kg)  Height: 4' 10.5" (1.486 m)   Body mass index is 24.45 kg/m.     05/10/2023    1:52 PM 05/06/2022    8:48 AM 12/08/2021    3:39 PM 11/04/2021    7:07 AM 04/22/2021    1:16 PM 04/19/2020    1:18 PM 03/17/2019    9:01 AM  Advanced Directives  Does Patient Have a Medical Advance Directive? Yes Yes Yes Yes Yes Yes No  Type of Estate agent of [place];Living will Healthcare Power of [place];Living will Living will Healthcare Power of [place];Living will Healthcare Power of [place];Living will Healthcare Power of [place];Living will   Does patient want to make changes to medical advance directive?  No - Patient declined   Yes (MAU/Ambulatory/Procedural Areas - Information given)    Copy of Healthcare Power of Attorney in Chart? Yes - validated most recent copy scanned in chart (See row information)  Yes - validated most recent copy scanned in chart (See row information)  No - copy requested Yes - validated most recent copy scanned in chart (See row information) No - copy requested   Would patient like information on creating a medical advance directive?       No - Patient declined    Current Medications (verified) Outpatient Encounter Medications as of 05/10/2023  Medication Sig   aspirin EC 81 MG tablet Take 1 tablet (81 mg total) by mouth daily. Swallow whole.   atorvastatin (LIPITOR) 40 MG tablet TAKE ONE TABLET BY MOUTH ONCE EVERY EVENING FOR CHOLESTEROL (NEED OFFICE VISIT FOR REFILLS)   CALCIUM PO Take 1,000 mg by mouth daily.   Cholecalciferol (VITAMIN D PO) Take 1,000 Units by mouth daily.   MAGNESIUM PO Take by mouth daily.   omeprazole (PRILOSEC) 10 MG capsule Take 10 mg by mouth daily.   diclofenac (VOLTAREN) 75 MG EC tablet Take 1 tablet (75 mg total) by mouth 2 (two) times daily. (Patient not taking: Reported on 05/10/2023)   No facility-administered encounter medications on file as of 05/10/2023.    Allergies (verified) Codeine   History: Past Medical History:  Diagnosis Date   Chickenpox    Genital warts    GERD (gastroesophageal reflux disease)    Hyperlipidemia    Urinary tract infection    Past Surgical History:  Procedure Laterality Date   CATARACT EXTRACTION Right 08/29/2019   CATARACT EXTRACTION Left 09/19/2019   COLONOSCOPY WITH ESOPHAGOGASTRODUODENOSCOPY (EGD)     COLONOSCOPY WITH  PROPOFOL N/A 11/04/2021   Procedure: COLONOSCOPY WITH PROPOFOL;  Surgeon: Toney Reil, MD;  Location: [facility] ENDOSCOPY;  Service: Gastroenterology;  Laterality: N/A;   ESOPHAGOGASTRODUODENOSCOPY (EGD) WITH PROPOFOL N/A 11/04/2021   Procedure: ESOPHAGOGASTRODUODENOSCOPY (EGD) WITH PROPOFOL;  Surgeon: Toney Reil, MD;  Location: [facility] ENDOSCOPY;  Service: Gastroenterology;  Laterality: N/A;   Family History  Problem Relation Age of Onset   Arthritis Mother     Diabetes Mother    Hearing loss Mother    Hypertension Mother    Kidney cancer Mother    Hearing loss Father    Stomach cancer Father    Asthma Brother    Heart Problems Brother    Breast cancer Neg Hx    Social History   Socioeconomic History   Marital status: Married    Spouse name: Not on file   Number of children: Not on file   Years of education: Not on file   Highest education level: Some college, no degree  Occupational History   Not on file  Tobacco Use   Smoking status: Former    Current packs/day: 0.00    Types: Cigarettes    Quit date: 06/15/1978    Years since quitting: 44.9   Smokeless tobacco: Never   Tobacco comments:    Stopping smoking 1980  Vaping Use   Vaping status: Never Used  Substance and Sexual Activity   Alcohol use: Yes    Comment: 1 glass of wine per month   Drug use: Not Currently   Sexual activity: Not on file  Other Topics Concern   Not on file  Social History Narrative   ** Merged History Encounter **       Married. 4 children, 5 grandchildren.  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  Enjoys reading, crossword puzzles, spending time with grandchildren.    Social Determinants of Health   Financial Resource Strain: Low Risk  (05/10/2023)   Overall Financial Resource Strain (CARDIA)    Difficulty of Paying Living Expenses: Not hard at all  Food Insecurity: No Food Insecurity (05/10/2023)   Hunger Vital Sign    Worried About Running Out of Food in the Last Year: Never true    Ran Out of Food in the Last Year: Never true  Transportation Needs: No Transportation Needs (05/10/2023)   PRAPARE - Administrator, Civil Service (Medical): No    Lack of Transportation (Non-Medical): No  Physical Activity: Insufficiently Active (05/10/2023)   Exercise Vital Sign    Days of Exercise per Week: 4 days    Minutes of Exercise per Session: 30 min  Stress: No Stress Concern Present (05/10/2023)   [employer] of Occupational Health - Occupational Stress Questionnaire    Feeling of Stress : Not at all  Social Connections: Moderately Integrated (05/10/2023)   Social Connection and Isolation Panel [NHANES]    Frequency of Communication with Friends and Family: Once a week    Frequency of Social Gatherings with Friends and Family: Once a week    Attends Religious Services: More than 4 times per year    Active Member of [employer] or Organizations: Yes    Attends Engineer, structural: More than 4 times per year    Marital Status: Married    Tobacco Counseling Counseling given: Not Answered Tobacco comments: Stopping smoking 1980   Clinical Intake:  Pre-visit preparation completed: Yes  Pain : No/denies pain     BMI - recorded: 24.45  Nutritional Status: BMI of 19-24  Normal Nutritional Risks: None Diabetes: Yes CBG done?: No Did pt. bring in CBG monitor from home?: No  How often do you need to have someone help you when you read instructions, pamphlets, or other written materials from your doctor or pharmacy?: 1 - Never  Interpreter Needed?: No  Comments: lives with husband and dad Information entered by :: B.Kerron Sedano,LPN   Activities of Daily Living    05/10/2023   12:46 PM  In your present state of health, do you have any difficulty performing the following activities:  Hearing? 0  Vision? 0  Difficulty concentrating or making decisions? 0  Walking or climbing stairs? 0  Dressing or bathing? 0  Doing errands, shopping? 0  Preparing Food and eating ? N  Using the Toilet? N  In the past six months, have you accidently leaked urine? N  Do you have problems with loss of bowel control? N  Managing your Medications? N  Managing your Finances? N  Housekeeping or managing your Housekeeping? N    Patient Care Team: Doreene Nest, NP as PCP - General (Internal Medicine)  Indicate any recent Medical Services you may have received from other than [facility]  providers in the past year (date may be approximate).     Assessment:   This is a routine wellness examination for [place].  Hearing/Vision screen Hearing Screening - Comments:: Pt says her hearing is good Vision Screening - Comments:: Pt says her vision is good after cataract surgery Dr Randon Goldsmith    Goals Addressed               This Visit's Progress     COMPLETED: DIET - INCREASE WATER INTAKE   On track     Starting 03/14/2018, I will continue to drink at least 6-8 glasses of water daily.      COMPLETED: No current goals (pt-stated)        COMPLETED: Patient Stated   On track     04/19/2020, I will continue to walk everyday for about 30 minutes.       COMPLETED: Patient Stated   On track     Maintain current lifestyle       Depression Screen    05/10/2023    1:48 PM 05/06/2022    8:45 AM 05/13/2021   11:39 AM 04/22/2021    1:19 PM 04/19/2020    1:19 PM 03/17/2019    9:02 AM 03/14/2018    8:12 AM  PHQ 2/9 Scores  PHQ - 2 Score 0 0 0 0 0 0 0  PHQ- 9 Score   0  0 0 0    Fall Risk    05/10/2023   12:46 PM 11/30/2022    8:26 AM 05/06/2022    8:47 AM 05/05/2022   10:03 AM 04/22/2021    1:18 PM  Fall Risk   Falls in the past year? 0 0 0 0 0  Number falls in past yr: 0 0 0  0  Injury with Fall? 0 0 0  0  Risk for fall due to : No Fall Risks No Fall Risks No Fall Risks  No Fall Risks  Follow up Education provided;Falls prevention discussed Falls evaluation completed Falls prevention discussed  Falls prevention discussed    MEDICARE RISK AT HOME: Medicare Risk at Home Any stairs in or around the home?: Yes If so, are there any without handrails?: Yes Home free of loose throw rugs in walkways, pet beds, electrical  cords, etc?: Yes Adequate lighting in your home to reduce risk of falls?: Yes Life alert?: No Use of a cane, walker or w/c?: No Grab bars in the bathroom?: Yes Shower chair or bench in shower?: No Elevated toilet seat or a handicapped toilet?: No  TIMED  UP AND GO:  Was the test performed?  No    Cognitive Function:    04/19/2020    1:21 PM 03/17/2019    9:05 AM 03/14/2018    8:13 AM  MMSE - Mini Mental State Exam  Orientation to time 1 5 5   Orientation to Place 5 5 5   Registration 3 3 3   Attention/ Calculation 5 5 0  Recall 3 3 3   Language- name 2 objects   0  Language- repeat 1 1 1   Language- follow 3 step command   3  Language- read & follow direction   0  Write a sentence   0  Copy design   0  Total score   20        05/10/2023    1:54 PM 05/06/2022    8:48 AM  6CIT Screen  What Year? 0 points 0 points  What month? 0 points 0 points  What time? 0 points 0 points  Count back from 20 0 points 0 points  Months in reverse 0 points 0 points  Repeat phrase 0 points 0 points  Total Score 0 points 0 points    Immunizations Immunization History  Administered Date(s) Administered   Fluad Quad(high Dose 65+) 05/18/2019, 04/24/2020, 05/13/2021, 05/15/2022   Influenza,inj,Quad PF,6+ Mos 03/16/2018   PFIZER(Purple Top)SARS-COV-2 Vaccination 07/28/2019, 08/22/2019   Pneumococcal Conjugate-13 03/20/2019   Pneumococcal Polysaccharide-23 03/16/2018   Td 06/16/2011   Zoster Recombinant(Shingrix) 08/13/2018, 08/13/2018    TDAP status: Up to date  Flu Vaccine status: Due, Education has been provided regarding the importance of this vaccine. Advised may receive this vaccine at local pharmacy or Health Dept. Aware to provide a copy of the vaccination record if obtained from local pharmacy or Health Dept. Verbalized acceptance and understanding.  Pneumococcal vaccine status: Up to date  Covid-19 vaccine status: Completed vaccines  Qualifies for Shingles Vaccine? Yes   Zostavax completed Yes   Shingrix Completed?: Yes  Screening Tests Health Maintenance  Topic Date Due   Zoster Vaccines- Shingrix (2 of 2) 10/08/2018   FOOT EXAM  06/28/2020   OPHTHALMOLOGY EXAM  09/13/2020   DTaP/Tdap/Td (2 - Tdap) 06/15/2021   Diabetic  kidney evaluation - Urine ACR  05/13/2022   HEMOGLOBIN A1C  11/14/2022   INFLUENZA VACCINE  01/14/2023   Diabetic kidney evaluation - eGFR measurement  05/16/2023   COVID-19 Vaccine (3 - 2023-24 season) 05/26/2023 (Originally 02/14/2023)   MAMMOGRAM  08/07/2023   Medicare Annual Wellness (AWV)  05/09/2024   Colonoscopy  11/05/2026   Pneumonia Vaccine 20+ Years old  Completed   DEXA SCAN  Completed   Hepatitis C Screening  Completed   HPV VACCINES  Aged Out    Health Maintenance  Health Maintenance Due  Topic Date Due   Zoster Vaccines- Shingrix (2 of 2) 10/08/2018   FOOT EXAM  06/28/2020   OPHTHALMOLOGY EXAM  09/13/2020   DTaP/Tdap/Td (2 - Tdap) 06/15/2021   Diabetic kidney evaluation - Urine ACR  05/13/2022   HEMOGLOBIN A1C  11/14/2022   INFLUENZA VACCINE  01/14/2023   Diabetic kidney evaluation - eGFR measurement  05/16/2023    Colorectal cancer screening: Type of screening: Colonoscopy. Completed 11/04/2021. Repeat every 5  years  Mammogram status: Completed 08/06/22. Repeat every year  Bone Density status: Completed 08/20/2021. Results reflect: Bone density results: OSTEOPENIA. Repeat every 3-5 years.  Lung Cancer Screening: (Low Dose CT Chest recommended if Age 59-80 years, 20 pack-year currently smoking OR have quit w/in 15years.) does not qualify.   Lung Cancer Screening Referral: no  Additional Screening:  Hepatitis C Screening: does not qualify; Completed 03/14/18  Vision Screening: Recommended annual ophthalmology exams for early detection of glaucoma and other disorders of the eye. Is the patient up to date with their annual eye exam?  Yes  Who is the provider or what is the name of the office in which the patient attends annual eye exams? Dr Randon Goldsmith If pt is not established with a provider, would they like to be referred to a provider to establish care? No .   Dental Screening: Recommended annual dental exams for proper oral hygiene  Diabetic Foot Exam: Diabetic  Foot Exam: Overdue, Pt has been advised about the importance in completing this exam. Pt is scheduled for diabetic foot exam on 05/18/23 PCP appt.  Community Resource Referral / Chronic Care Management: CRR required this visit?  No   CCM required this visit?  No    Plan:     I have personally reviewed and noted the following in the patient's chart:   Medical and social history Use of alcohol, tobacco or illicit drugs  Current medications and supplements including opioid prescriptions. Patient is currently taking opioid prescriptions. Information provided to patient regarding non-opioid alternatives. Patient advised to discuss non-opioid treatment plan with their provider. Functional ability and status Nutritional status Physical activity Advanced directives List of other physicians Hospitalizations, surgeries, and ER visits in previous 12 months Vitals Screenings to include cognitive, depression, and falls Referrals and appointments  In addition, I have reviewed and discussed with patient certain preventive protocols, quality metrics, and best practice recommendations. A written personalized care plan for preventive services as well as general preventive health recommendations were provided to patient.    Sue Lush, LPN   [id number]/[id number]   After Visit Summary: (MyChart) Due to this being a telephonic visit, the after visit summary with patients personalized plan was offered to patient via MyChart   Nurse Notes: The patient states she is doing well and has no concerns or questions at this time.

## 2023-05-10 NOTE — Patient Instructions (Signed)
Kayla Ritter , Thank you for taking time to come for your Medicare Wellness Visit. I appreciate your ongoing commitment to your health goals. Please review the following plan we discussed and let me know if I can assist you in the future.   Referrals/Orders/Follow-Ups/Clinician Recommendations: none  This is a list of the screening recommended for you and due dates:  Health Maintenance  Topic Date Due   Zoster (Shingles) Vaccine (2 of 2) 10/08/2018   Complete foot exam   06/28/2020   Eye exam for diabetics  09/13/2020   DTaP/Tdap/Td vaccine (2 - Tdap) 06/15/2021   Yearly kidney health urinalysis for diabetes  05/13/2022   Hemoglobin A1C  11/14/2022   Flu Shot  01/14/2023   COVID-19 Vaccine (3 - 2023-24 season) 02/14/2023   Yearly kidney function blood test for diabetes  05/16/2023   Mammogram  08/07/2023   Medicare Annual Wellness Visit  05/09/2024   Colon Cancer Screening  11/05/2026   Pneumonia Vaccine  Completed   DEXA scan (bone density measurement)  Completed   Hepatitis C Screening  Completed   HPV Vaccine  Aged Out    Advanced directives: (In Chart) A copy of your advanced directives are scanned into your chart should your provider ever need it.  Next Medicare Annual Wellness Visit scheduled for next year: Yes  05/10/2024 @ 1:40pm telephone

## 2023-05-17 DIAGNOSIS — H401231 Low-tension glaucoma, bilateral, mild stage: Secondary | ICD-10-CM | POA: Diagnosis not present

## 2023-05-18 ENCOUNTER — Encounter: Payer: PPO | Admitting: Primary Care

## 2023-05-18 ENCOUNTER — Ambulatory Visit (INDEPENDENT_AMBULATORY_CARE_PROVIDER_SITE_OTHER): Payer: PPO | Admitting: Primary Care

## 2023-05-18 ENCOUNTER — Encounter: Payer: Self-pay | Admitting: Primary Care

## 2023-05-18 VITALS — BP 108/72 | HR 53 | Temp 97.2°F | Ht 58.5 in | Wt 113.0 lb

## 2023-05-18 DIAGNOSIS — E1165 Type 2 diabetes mellitus with hyperglycemia: Secondary | ICD-10-CM

## 2023-05-18 DIAGNOSIS — M8580 Other specified disorders of bone density and structure, unspecified site: Secondary | ICD-10-CM | POA: Diagnosis not present

## 2023-05-18 DIAGNOSIS — Z0000 Encounter for general adult medical examination without abnormal findings: Secondary | ICD-10-CM

## 2023-05-18 DIAGNOSIS — E2839 Other primary ovarian failure: Secondary | ICD-10-CM | POA: Diagnosis not present

## 2023-05-18 DIAGNOSIS — K529 Noninfective gastroenteritis and colitis, unspecified: Secondary | ICD-10-CM | POA: Diagnosis not present

## 2023-05-18 DIAGNOSIS — E785 Hyperlipidemia, unspecified: Secondary | ICD-10-CM

## 2023-05-18 DIAGNOSIS — Z1231 Encounter for screening mammogram for malignant neoplasm of breast: Secondary | ICD-10-CM

## 2023-05-18 DIAGNOSIS — K8689 Other specified diseases of pancreas: Secondary | ICD-10-CM

## 2023-05-18 DIAGNOSIS — K219 Gastro-esophageal reflux disease without esophagitis: Secondary | ICD-10-CM | POA: Diagnosis not present

## 2023-05-18 NOTE — Assessment & Plan Note (Signed)
Resolved.  Consider repeat pancreatic testing if symptoms return. She will update.

## 2023-05-18 NOTE — Assessment & Plan Note (Signed)
Repeat lipid panel pending. Continue atorvastatin 40 mg daily.

## 2023-05-18 NOTE — Progress Notes (Signed)
Subjective:    Patient ID: Kayla Ritter, female    DOB: 1951-06-02, 72 y.o.   MRN: [id number]  HPI  Kayla Ritter is a very pleasant 72 y.o. female with a history of pancreatic insufficiency, controlled type 2 diabetes, osteopenia, hyperlipidemia, chronic diarrhea who presents today for complete physical and follow up of chronic conditions.  Immunizations: -Tetanus: Completed in 2013 -Influenza: Declines influenza vaccine.  -Shingles: Completed Shingrix series -Pneumonia: Completed Prevnar 13 in 2020, Pneumovax 23 in 2019  Diet: Fair diet.  Exercise: Active  Eye exam: Completes annually  Dental exam: Completes semi-annually    Mammogram: Completed in February 2023 Bone Density Scan: Completed in March 2023  Colonoscopy: Completed in 2023, due 2028  BP Readings from Last 3 Encounters:  05/18/23 108/72  11/30/22 112/70  05/15/22 122/70       Review of Systems  Constitutional:  Negative for unexpected weight change.  HENT:  Negative for rhinorrhea.   Respiratory:  Negative for cough and shortness of breath.   Cardiovascular:  Negative for chest pain.  Gastrointestinal:  Negative for constipation and diarrhea.  Genitourinary:  Negative for difficulty urinating.  Musculoskeletal:  Negative for arthralgias and myalgias.  Skin:  Negative for rash.  Allergic/Immunologic: Negative for environmental allergies.  Neurological:  Negative for dizziness and headaches.  Psychiatric/Behavioral:  The patient is not nervous/anxious.          Past Medical History:  Diagnosis Date  . Adrenal nodule (HCC) 11/20/2021  . Chickenpox   . Genital warts   . GERD (gastroesophageal reflux disease)   . Hyperlipidemia   . Urinary tract infection     Social History   Socioeconomic History  . Marital status: Married    Spouse name: Not on file  . Number of children: Not on file  . Years of education: Not on file  . Highest education level: Some college, no degree  Occupational  History  . Not on file  Tobacco Use  . Smoking status: Former    Current packs/day: 0.00    Types: Cigarettes    Quit date: 06/15/1978    Years since quitting: 44.9  . Smokeless tobacco: Never  . Tobacco comments:    Stopping smoking 1980  Vaping Use  . Vaping status: Never Used  Substance and Sexual Activity  . Alcohol use: Yes    Comment: 1 glass of wine per month  . Drug use: Not Currently  . Sexual activity: Not on file  Other Topics Concern  . Not on file  Social History Narrative   ** Merged History Encounter **       Married. 4 children, 5 grandchildren.  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  Enjoys reading, crossword puzzles, spending time with grandchildren.    Social Determinants of Health   Financial Resource Strain: Low Risk  (05/12/2023)   Overall Financial Resource Strain (CARDIA)   . Difficulty of Paying Living Expenses: Not hard at all  Food Insecurity: No Food Insecurity (05/12/2023)   Hunger Vital Sign   . Worried About Programme researcher, broadcasting/film/video in the Last Year: Never true   . Ran Out of Food in the Last Year: Never true  Transportation Needs: No Transportation Needs (05/12/2023)   PRAPARE - Transportation   . Lack of Transportation (Medical): No   . Lack of Transportation (Non-Medical): No  Physical Activity: Sufficiently Active (05/12/2023)   Exercise Vital Sign   . Days of Exercise per Week: 5  days   . Minutes of Exercise per Session: 30 min  Recent Concern: Physical Activity - Insufficiently Active (05/10/2023)   Exercise Vital Sign   . Days of Exercise per Week: 4 days   . Minutes of Exercise per Session: 30 min  Stress: No Stress Concern Present (05/12/2023)   [employer] of Occupational Health - Occupational Stress Questionnaire   . Feeling of Stress : Not at all  Social Connections: Socially Integrated (05/12/2023)   Social Connection and Isolation Panel [NHANES]   . Frequency of Communication with Friends and  Family: Once a week   . Frequency of Social Gatherings with Friends and Family: Twice a week   . Attends Religious Services: More than 4 times per year   . Active Member of Clubs or Organizations: Yes   . Attends Banker Meetings: More than 4 times per year   . Marital Status: Married  Catering manager Violence: Not At Risk (05/10/2023)   Humiliation, Afraid, Rape, and Kick questionnaire   . Fear of Current or Ex-Partner: No   . Emotionally Abused: No   . Physically Abused: No   . Sexually Abused: No    Past Surgical History:  Procedure Laterality Date  . CATARACT EXTRACTION Right 08/29/2019  . CATARACT EXTRACTION Left 09/19/2019  . COLONOSCOPY WITH ESOPHAGOGASTRODUODENOSCOPY (EGD)    . COLONOSCOPY WITH PROPOFOL N/A 11/04/2021   Procedure: COLONOSCOPY WITH PROPOFOL;  Surgeon: Toney Reil, MD;  Location: [facility] ENDOSCOPY;  Service: Gastroenterology;  Laterality: N/A;  . ESOPHAGOGASTRODUODENOSCOPY (EGD) WITH PROPOFOL N/A 11/04/2021   Procedure: ESOPHAGOGASTRODUODENOSCOPY (EGD) WITH PROPOFOL;  Surgeon: Toney Reil, MD;  Location: [facility] ENDOSCOPY;  Service: Gastroenterology;  Laterality: N/A;    Family History  Problem Relation Age of Onset  . Arthritis Mother   . Diabetes Mother   . Hearing loss Mother   . Hypertension Mother   . Kidney cancer Mother   . Hearing loss Father   . Stomach cancer Father   . Asthma Brother   . Heart Problems Brother   . Breast cancer Neg Hx     Allergies  Allergen Reactions  . Codeine     REACTION: N \\T \ V    Current Outpatient Medications on File Prior to Visit  Medication Sig Dispense Refill  . aspirin EC 81 MG tablet Take 1 tablet (81 mg total) by mouth daily. Swallow whole.    Marland Kitchen atorvastatin (LIPITOR) 40 MG tablet TAKE ONE TABLET BY MOUTH ONCE EVERY EVENING FOR CHOLESTEROL (NEED OFFICE VISIT FOR REFILLS) 90 tablet 0  . CALCIUM PO Take 1,000 mg by mouth daily.    . Cholecalciferol (VITAMIN D PO) Take 1,000 Units by  mouth daily.    Marland Kitchen MAGNESIUM PO Take by mouth daily.    Marland Kitchen omeprazole (PRILOSEC) 10 MG capsule Take 10 mg by mouth daily.     No current facility-administered medications on file prior to visit.    BP 108/72   Pulse (!) 53   Temp (!) 97.2 F (36.2 C) (Temporal)   Ht 4' 10.5" (1.486 m)   Wt 113 lb (51.3 kg)   SpO2 100%   BMI 23.22 kg/m  Objective:   Physical Exam HENT:     Right Ear: Tympanic membrane and ear canal normal.     Left Ear: Tympanic membrane and ear canal normal.  Eyes:     Pupils: Pupils are equal, round, and reactive to light.  Cardiovascular:     Rate and Rhythm: Normal rate and  regular rhythm.  Pulmonary:     Effort: Pulmonary effort is normal.     Breath sounds: Normal breath sounds.  Abdominal:     General: Bowel sounds are normal.     Palpations: Abdomen is soft.     Tenderness: There is no abdominal tenderness.  Musculoskeletal:        General: Normal range of motion.     Cervical back: Neck supple.  Skin:    General: Skin is warm and dry.  Neurological:     Mental Status: She is alert and oriented to person, place, and time.     Cranial Nerves: No cranial nerve deficit.     Deep Tendon Reflexes:     Reflex Scores:      Patellar reflexes are 2+ on the right side and 2+ on the left side. Psychiatric:        Mood and Affect: Mood normal.          Assessment & Plan:  Preventative health care Assessment & Plan: Discussed that she may need to restart her Shingrix vaccines.  Declines influenza vaccine today. Mammogram due, orders placed. Repeat bone density scan in march 2025 Colonoscopy UTD, due 2028  Discussed the importance of a healthy diet and regular exercise in order for weight loss, and to reduce the risk of further co-morbidity.  Exam stable. Labs pending.  Follow up in 1 year for repeat physical.    Screening mammogram for breast cancer -     3D Screening Mammogram, Left and Right; Future  Estrogen deficiency -     DG Bone  Density; Future  Gastroesophageal reflux disease, unspecified whether esophagitis present Assessment & Plan: Controlled.  Continue omeprazole 10 mg daily.   Chronic diarrhea Assessment & Plan: Resolved.  Consider repeat pancreatic testing if symptoms return. She will update.   Pancreatic insufficiency Assessment & Plan: Resolved.  Chronic since Covid-19 vaccine.   Consider repeat pancreatic testing in the future if symptoms return.     Controlled type 2 diabetes mellitus with hyperglycemia, without long-term current use of insulin (HCC) Assessment & Plan: Repeat A1C pending.  Continue off treatment.  Orders: -     Hemoglobin A1c -     Microalbumin / creatinine urine ratio  Osteopenia, unspecified location Assessment & Plan: Repeat bone density scan in March 2025. Remain off Fosamax as she discontinued in May 2024.  Continue calcium and vitamin D.   Hyperlipidemia, unspecified hyperlipidemia type Assessment & Plan: Repeat lipid panel pending. Continue atorvastatin 40 mg daily.  Orders: -     Lipid panel -     Comprehensive metabolic panel        Doreene Nest, NP

## 2023-05-18 NOTE — Assessment & Plan Note (Signed)
Repeat bone density scan in March 2025. Remain off Fosamax as she discontinued in May 2024.  Continue calcium and vitamin D.

## 2023-05-18 NOTE — Patient Instructions (Signed)
Stop by the lab prior to leaving today. I will notify you of your results once received.   [facility] schedule your mamm[facility] and bone density scan.   It was a pleasure to see you today!   

## 2023-05-18 NOTE — Assessment & Plan Note (Signed)
Repeat A1C pending.  Continue off treatment.

## 2023-05-18 NOTE — Assessment & Plan Note (Signed)
Discussed that she may need to restart her Shingrix vaccines.  Declines influenza vaccine today. Mammogram due, orders placed. Repeat bone density scan in march 2025 Colonoscopy UTD, due 2028  Discussed the importance of a healthy diet and regular exercise in order for weight loss, and to reduce the risk of further co-morbidity.  Exam stable. Labs pending.  Follow up in 1 year for repeat physical.

## 2023-05-18 NOTE — Assessment & Plan Note (Signed)
Resolved.  Chronic since Covid-19 vaccine.   Consider repeat pancreatic testing in the future if symptoms return.

## 2023-05-18 NOTE — Assessment & Plan Note (Signed)
Controlled.  Continue omeprazole 10 mg daily.

## 2023-05-19 LAB — LIPID PANEL
Cholesterol: 130 mg/dL (ref 0–200)
HDL: 48.5 mg/dL (ref >39.00)
LDL Cholesterol: 64 mg/dL (ref 0–99)
NonHDL: 81.21
Total CHOL/HDL Ratio: 3
Triglycerides: 84 mg/dL (ref 0.0–149.0)
VLDL: 16.8 mg/dL (ref 0.0–40.0)

## 2023-05-19 LAB — COMPREHENSIVE METABOLIC PANEL
ALT: 27 U/L (ref 0–35)
AST: 21 U/L (ref 0–37)
Albumin: 4.3 g/dL (ref 3.5–5.2)
Alkaline Phosphatase: 66 U/L (ref 39–117)
BUN: 11 mg/dL (ref 6–23)
CO2: 32 meq/L (ref 19–32)
Calcium: 9.8 mg/dL (ref 8.4–10.5)
Chloride: 101 meq/L (ref 96–112)
Creatinine, Ser: 0.77 mg/dL (ref 0.40–1.20)
GFR: 77.03 mL/min (ref >60.00)
Glucose, Bld: 87 mg/dL (ref 70–99)
Potassium: 4.1 meq/L (ref 3.5–5.1)
Sodium: 139 meq/L (ref 135–145)
Total Bilirubin: 0.9 mg/dL (ref 0.2–1.2)
Total Protein: 6.9 g/dL (ref 6.0–8.3)

## 2023-05-19 LAB — MICROALBUMIN / CREATININE URINE RATIO
Creatinine,U: 25.6 mg/dL
Microalb Creat Ratio: 2.7 mg/g (ref 0.0–30.0)
Microalb, Ur: <0.7 mg/dL (ref 0.0–1.9)

## 2023-05-19 LAB — HEMOGLOBIN A1C: Hgb A1c MFr Bld: 6.2 % (ref 4.6–6.5)

## 2023-07-27 ENCOUNTER — Other Ambulatory Visit (HOSPITAL_COMMUNITY): Payer: Self-pay

## 2023-08-02 ENCOUNTER — Other Ambulatory Visit (HOSPITAL_COMMUNITY): Payer: Self-pay

## 2023-08-02 ENCOUNTER — Other Ambulatory Visit: Payer: Self-pay | Admitting: Primary Care

## 2023-08-02 DIAGNOSIS — E785 Hyperlipidemia, unspecified: Secondary | ICD-10-CM

## 2023-08-02 MED ORDER — ATORVASTATIN CALCIUM 40 MG PO TABS
40.0000 mg | ORAL_TABLET | Freq: Every day | ORAL | 2 refills | Status: DC
  Filled 2023-08-02: qty 90, 90d supply, fill #0
  Filled 2023-10-27: qty 90, 90d supply, fill #1
  Filled 2024-01-24: qty 90, 90d supply, fill #2

## 2023-08-12 ENCOUNTER — Other Ambulatory Visit (HOSPITAL_BASED_OUTPATIENT_CLINIC_OR_DEPARTMENT_OTHER): Payer: Self-pay

## 2023-08-23 ENCOUNTER — Ambulatory Visit
Admission: RE | Admit: 2023-08-23 | Discharge: 2023-08-23 | Disposition: A | Discharge status: Home / Self Care | Payer: PPO | Source: Ambulatory Visit | Attending: Primary Care | Admitting: Primary Care

## 2023-08-23 DIAGNOSIS — Z1231 Encounter for screening mammogram for malignant neoplasm of breast: Secondary | ICD-10-CM | POA: Diagnosis not present

## 2023-08-23 DIAGNOSIS — E2839 Other primary ovarian failure: Secondary | ICD-10-CM

## 2023-08-23 DIAGNOSIS — M8589 Other specified disorders of bone density and structure, multiple sites: Secondary | ICD-10-CM | POA: Diagnosis not present

## 2023-08-26 ENCOUNTER — Other Ambulatory Visit: Payer: Self-pay | Admitting: Primary Care

## 2023-08-26 DIAGNOSIS — R928 Other abnormal and inconclusive findings on diagnostic imaging of breast: Secondary | ICD-10-CM

## 2023-09-01 ENCOUNTER — Ambulatory Visit
Admission: RE | Admit: 2023-09-01 | Discharge: 2023-09-01 | Disposition: A | Discharge status: Home / Self Care | Source: Ambulatory Visit | Attending: Primary Care | Admitting: Primary Care

## 2023-09-01 DIAGNOSIS — R928 Other abnormal and inconclusive findings on diagnostic imaging of breast: Secondary | ICD-10-CM

## 2023-09-01 DIAGNOSIS — N6323 Unspecified lump in the left breast, lower outer quadrant: Secondary | ICD-10-CM | POA: Diagnosis not present

## 2023-09-01 DIAGNOSIS — N6324 Unspecified lump in the left breast, lower inner quadrant: Secondary | ICD-10-CM | POA: Diagnosis not present

## 2023-09-01 DIAGNOSIS — R92322 Mammographic fibroglandular density, left breast: Secondary | ICD-10-CM | POA: Diagnosis not present

## 2023-09-14 DIAGNOSIS — D225 Melanocytic nevi of trunk: Secondary | ICD-10-CM | POA: Diagnosis not present

## 2023-09-14 DIAGNOSIS — L821 Other seborrheic keratosis: Secondary | ICD-10-CM | POA: Diagnosis not present

## 2023-09-14 DIAGNOSIS — L814 Other melanin hyperpigmentation: Secondary | ICD-10-CM | POA: Diagnosis not present

## 2023-10-27 ENCOUNTER — Other Ambulatory Visit (HOSPITAL_COMMUNITY): Payer: Self-pay

## 2024-01-24 ENCOUNTER — Other Ambulatory Visit (HOSPITAL_COMMUNITY): Payer: Self-pay

## 2024-01-28 DIAGNOSIS — Z961 Presence of intraocular lens: Secondary | ICD-10-CM | POA: Diagnosis not present

## 2024-01-28 DIAGNOSIS — H401231 Low-tension glaucoma, bilateral, mild stage: Secondary | ICD-10-CM | POA: Diagnosis not present

## 2024-02-01 ENCOUNTER — Other Ambulatory Visit: Payer: Self-pay | Admitting: Primary Care

## 2024-02-01 DIAGNOSIS — R928 Other abnormal and inconclusive findings on diagnostic imaging of breast: Secondary | ICD-10-CM

## 2024-02-01 DIAGNOSIS — N63 Unspecified lump in unspecified breast: Secondary | ICD-10-CM

## 2024-02-01 DIAGNOSIS — N6324 Unspecified lump in the left breast, lower inner quadrant: Secondary | ICD-10-CM

## 2024-02-15 ENCOUNTER — Other Ambulatory Visit (HOSPITAL_COMMUNITY): Payer: Self-pay

## 2024-02-15 ENCOUNTER — Other Ambulatory Visit: Payer: Self-pay

## 2024-02-15 MED ORDER — CEPHALEXIN 500 MG PO CAPS
500.0000 mg | ORAL_CAPSULE | Freq: Four times a day (QID) | ORAL | 0 refills | Status: DC
  Filled 2024-02-15: qty 28, 7d supply, fill #0

## 2024-02-15 MED ORDER — DIAZEPAM 5 MG PO TABS
5.0000 mg | ORAL_TABLET | Freq: Once | ORAL | 0 refills | Status: AC
  Filled 2024-02-15: qty 2, 2d supply, fill #0

## 2024-02-15 MED ORDER — DEXAMETHASONE 1 MG PO TABS
ORAL_TABLET | ORAL | 0 refills | Status: AC
  Filled 2024-02-15: qty 10, 4d supply, fill #0

## 2024-02-16 ENCOUNTER — Other Ambulatory Visit: Payer: Self-pay

## 2024-02-16 ENCOUNTER — Encounter: Payer: Self-pay | Admitting: Pharmacist

## 2024-02-17 ENCOUNTER — Other Ambulatory Visit (HOSPITAL_COMMUNITY): Payer: Self-pay

## 2024-03-15 ENCOUNTER — Ambulatory Visit
Admission: RE | Admit: 2024-03-15 | Discharge: 2024-03-15 | Disposition: A | Discharge status: Home / Self Care | Source: Ambulatory Visit | Attending: Primary Care | Admitting: Primary Care

## 2024-03-15 DIAGNOSIS — N63 Unspecified lump in unspecified breast: Secondary | ICD-10-CM | POA: Diagnosis not present

## 2024-03-15 DIAGNOSIS — N6324 Unspecified lump in the left breast, lower inner quadrant: Secondary | ICD-10-CM

## 2024-03-15 DIAGNOSIS — R928 Other abnormal and inconclusive findings on diagnostic imaging of breast: Secondary | ICD-10-CM | POA: Insufficient documentation

## 2024-03-15 DIAGNOSIS — R92322 Mammographic fibroglandular density, left breast: Secondary | ICD-10-CM | POA: Diagnosis not present

## 2024-03-20 ENCOUNTER — Ambulatory Visit: Payer: Self-pay | Admitting: Primary Care

## 2024-03-24 ENCOUNTER — Ambulatory Visit: Payer: Self-pay

## 2024-03-24 NOTE — Telephone Encounter (Signed)
 FYI Only or Action Required?: FYI only for provider.  Patient was last seen in primary care on 05/18/2023 by Gretta Comer POUR, NP.  Called Nurse Triage reporting Neck Pain.  Symptoms began several days ago.  Interventions attempted: OTC medications: acetaminophen.  Symptoms are: unchanged.  Triage Disposition: See PCP When Office is Open (Within 3 Days)  Patient/caregiver understands and will follow disposition?: Yes  Copied from CRM #[id number]. Topic: Clinical - Red Word Triage >> Mar 24, 2024 12:42 PM Donna BRAVO wrote: Red Word that prompted transfer to Nurse Triage: patient calling stating she has neck pain, possible pinched nerve, pain on right side of neck and the pain is in on the right side of of neck shoulder blade and down past the elbow, this is a constant pain,eases off a bit when taking acetaminophen pain lasts the whole day. Reason for Disposition  [1] MODERATE neck pain (e.g., interferes with normal activities) AND [2] present > 3 days  Answer Assessment - Initial Assessment Questions 1. ONSET: When did the pain begin?      Three days ago 2. LOCATION: Where does it hurt?      Neck/right neck pain 3. PATTERN Does the pain come and go, or has it been constant since it started?      constant 4. SEVERITY: How bad is the pain?  (Scale 0-10; or none or slight stiffness, mild, moderate, severe)     Unmediated, 7/10, with acetaminophen 4/10 5. RADIATION: Does the pain go anywhere else, shoot into your arms?     Radiates down right arm toward elbow 6. CORD SYMPTOMS: Any weakness or numbness of the arms or legs?     Denies weakness 7. CAUSE: What do you think is causing the neck pain?    Has had this pain before Unsure, may have slept wrong, but denies injury 8. NECK OVERUSE: Any recent activities that involved turning or twisting the neck?     denies 9. OTHER SYMPTOMS: Do you have any other symptoms? (e.g., headache, fever, chest pain, difficulty breathing,  neck swelling)     denies 10. PREGNANCY: Is there any chance you are pregnant? When was your last menstrual period?       N/a  Protocols used: Neck Pain or Stiffness-A-AH

## 2024-03-24 NOTE — Telephone Encounter (Signed)
 Noted. Appreciate Dr. Laurelyn Ponder evaluation

## 2024-03-25 DIAGNOSIS — M5410 Radiculopathy, site unspecified: Secondary | ICD-10-CM | POA: Diagnosis not present

## 2024-03-26 NOTE — Progress Notes (Signed)
 "    Kayla Rapaport T. [name], MD, CAQ Sports Medicine [facility] [address] [place], [address]  Phone: [phone]  FAX: [phone]  [name] - 73 y.o. female  MRN [id number]  Date of Birth: 1951-02-26  Date: 03/29/2024  PCP: Gretta Comer POUR, NP  Referral: Gretta Comer POUR, NP  Chief Complaint  Patient presents with   Neck Pain    Seen in ED 03/25/24   Subjective:   Kayla Ritter is a 73 y.o. very pleasant female patient with Body mass index is 24.29 kg/m. who presents with the following:  Discussed the use of AI scribe software for clinical note transcription with the patient, who gave verbal consent to proceed.  Dorthea presents with ongoing neck pain.  I have seen her in the past for neck pain and also for back pain, but it has been roughly 18 months.  MRI of the cervical spine from Oct 21, 2020 does show extensive multilevel cervical disease disease as well as moderate spinal stenosis mass cord effect, severe neuroforaminal stenosis at C4, C5-C6, and C7.  She actually went to the emergency room on Saturday for radicular neck pain at the [facility]. they gave her some Naprosyn, prednisone , and Flexeril.  Prednisone  40 mg x 5 days. History of Present Illness Kayla Ritter is a 73 year old female who presents with right shoulder and arm pain, suspected to be cervical origin.  She has been experiencing symptoms similar to a previous episode, which she describes as a 'pinched nerve.' The symptoms began on Wednesday with aching in her right shoulder and upper arm, which worsened by Friday, leading to a visit to the ER where she received medication. The pain is described as a 'throb or ache' in her shoulder, extending to her elbow, with tingling in the first and second fingers of her right hand. No weakness in her hand or arm is noted, and she maintains her strength, although holding her arm up results in aching and  pain. No symptoms are present on the left side.  In the ER, she was prescribed prednisone , cyclobenzaprine, and Naprosyn. She has completed most of the prednisone  course and feels some relief from the medication, though the pain returns when it wears off. She has not engaged in recent neck or core strengthening exercises but has been stretching.  During the review of symptoms, she reports no numbness in the left hand but confirms tingling in the first and second fingers of the right hand, describing the sensation as sharp rather than dull. The pain is less when lying down with slight elevation.    Review of Systems is noted in the HPI, as appropriate  Objective:   BP (!) 160/80   Pulse (!) 57   Temp 97.8 F (36.6 C) (Temporal)   Ht 4' 10.5 (1.486 m)   Wt 118 lb 4 oz (53.6 kg)   SpO2 98%   BMI 24.29 kg/m   GEN: No acute distress; alert,appropriate. PULM: Breathing comfortably in no respiratory distress PSYCH: Normally interactive.    CERVICAL SPINE EXAM Range of motion: Flexion, extension, lateral bending, and rotation: Roughly 10% impaired motion Spurling's: Negative Pain with terminal motion: Yes Spinous Processes: NT SCM: NT Upper paracervical muscles: Mildly tender to palpation, right greater than left Upper traps: NT C5-T1 sensation and motor: Motor is full On the dorsum of the forearm and digits 1 and 2 there is some decrease sensation to dull  touch, but pinprick is normal  Laboratory and Imaging Data:  Assessment and Plan:     ICD-10-CM   1. Cervical disc disorder with radiculopathy of cervical region  M50.10     2. Degenerative disc disease, cervical  M50.30      Assessment & Plan Cervical radiculopathy of the right upper extremity with degenerative cervical disc disease Cervical radiculopathy with shoulder and arm aching, tingling in right hand digits, no weakness. Symptoms suggest nerve compression from degenerative cervical disc disease. Expected  improvement with treatment. - Extend prednisone  course to reduce nerve pressure. - Restart neck physical therapy with chin tuck and tilt exercises. - Perform self-administered traction using a towel as previously instructed. - Monitor symptoms and report in 2-3 weeks if no improvement.  Medication Management during today's office visit: Meds ordered this encounter  Medications   DISCONTD: predniSONE  (DELTASONE ) 20 MG tablet    Sig: 2 tabs po daily for 5 days, then 1 tab po daily for 5 days    Dispense:  15 tablet    Refill:  0   predniSONE  (DELTASONE ) 20 MG tablet    Sig: 2 tabs po daily for 5 days, then 1 tab po daily for 5 days    Dispense:  15 tablet    Refill:  0   Medications Discontinued During This Encounter  Medication Reason   cephALEXin  (KEFLEX ) 500 MG capsule Completed Course   predniSONE  (DELTASONE ) 20 MG tablet    predniSONE  (DELTASONE ) 20 MG tablet     Orders placed today for conditions managed today: No orders of the defined types were placed in this encounter.   Disposition: No follow-ups on file.  [employer] speech-to-text software was used for transcription in this dictation.  Possible transcriptional errors can occur using Animal nutritionist.   Signed,  Jacques DASEN. Trinity Haun, MD   Outpatient Encounter Medications as of 03/29/2024  Medication Sig   cyclobenzaprine (FLEXERIL) 10 MG tablet Take 10 mg by mouth 3 (three) times daily as needed.   naproxen (NAPROSYN) 500 MG tablet Take 500 mg by mouth 2 (two) times daily with a meal.   predniSONE  (DELTASONE ) 20 MG tablet 2 tabs po daily for 5 days, then 1 tab po daily for 5 days   [DISCONTINUED] predniSONE  (DELTASONE ) 20 MG tablet Take 40 mg by mouth daily with breakfast.   [DISCONTINUED] predniSONE  (DELTASONE ) 20 MG tablet 2 tabs po daily for 5 days, then 1 tab po daily for 5 days   aspirin  EC 81 MG tablet Take 1 tablet (81 mg total) by mouth daily. Swallow whole.   atorvastatin  (LIPITOR) 40 MG tablet Take  1 tablet (40 mg total) by mouth daily. for cholesterol.   CALCIUM  PO Take 1,000 mg by mouth daily.   Cholecalciferol (VITAMIN D  PO) Take 1,000 Units by mouth daily.   MAGNESIUM PO Take by mouth daily.   omeprazole (PRILOSEC) 10 MG capsule Take 10 mg by mouth daily.   [DISCONTINUED] cephALEXin  (KEFLEX ) 500 MG capsule Take 1 capsule (500 mg total) by mouth every 6 (six) hours starting day before surgery. Take until gone   No facility-administered encounter medications on file as of 03/29/2024.   "

## 2024-03-29 ENCOUNTER — Other Ambulatory Visit (HOSPITAL_COMMUNITY): Payer: Self-pay

## 2024-03-29 ENCOUNTER — Other Ambulatory Visit: Payer: Self-pay

## 2024-03-29 ENCOUNTER — Ambulatory Visit: Admitting: Family Medicine

## 2024-03-29 ENCOUNTER — Encounter: Payer: Self-pay | Admitting: Family Medicine

## 2024-03-29 VITALS — BP 160/80 | HR 57 | Temp 97.8°F | Ht 58.5 in | Wt 118.2 lb

## 2024-03-29 DIAGNOSIS — M5030 Other cervical disc degeneration, unspecified cervical region: Secondary | ICD-10-CM | POA: Diagnosis not present

## 2024-03-29 DIAGNOSIS — M5010 Cervical disc disorder with radiculopathy, unspecified cervical region: Secondary | ICD-10-CM | POA: Diagnosis not present

## 2024-03-29 MED ORDER — PREDNISONE 20 MG PO TABS
ORAL_TABLET | ORAL | 0 refills | Status: DC
  Filled 2024-03-29: qty 15, fill #0

## 2024-03-29 MED ORDER — PREDNISONE 20 MG PO TABS
ORAL_TABLET | ORAL | 0 refills | Status: DC
Start: 2024-03-29 — End: 2024-04-17

## 2024-04-14 ENCOUNTER — Encounter: Payer: Self-pay | Admitting: Family Medicine

## 2024-04-14 DIAGNOSIS — M5010 Cervical disc disorder with radiculopathy, unspecified cervical region: Secondary | ICD-10-CM

## 2024-04-15 ENCOUNTER — Ambulatory Visit
Admission: EM | Admit: 2024-04-15 | Discharge: 2024-04-15 | Disposition: A | Discharge status: Home / Self Care | Attending: Emergency Medicine | Admitting: Emergency Medicine

## 2024-04-15 DIAGNOSIS — M5412 Radiculopathy, cervical region: Secondary | ICD-10-CM

## 2024-04-15 MED ORDER — TRAMADOL HCL 50 MG PO TABS: 50.0000 mg | ORAL_TABLET | Freq: Four times a day (QID) | ORAL | 0 refills | Status: DC | PRN

## 2024-04-15 NOTE — ED Provider Notes (Signed)
 [name]    CSN: [id number] Arrival date & time: 04/15/24  9060      History   Chief Complaint Chief Complaint  Patient presents with   Neck Injury    HPI Kayla Ritter is a 73 y.o. female.  Patient presents with right neck pain radiating to her right shoulder and right arm.  She has some tingling in her fingers also.  She states this is a flare of her cervical radiculopathy.  The current symptoms started a couple of weeks ago when [place] was at the beach.  She was seen in the ED there at that time.  No falls or injury.  She has been treated with prednisone  and muscle relaxer.  While she was in the ED, she was given 4 tablets of tramadol to treat her pain which she reports helped get her through until she could get home to see her own sports medicine doctor.  Her symptoms got worse again a couple of nights ago.  She is having trouble sleeping due to the pain.  She has been taking ibuprofen without relief.  No wounds, redness, bruising.  Patient was seen at [facility] emergency department on 03/25/2024; diagnosed with radiculopathy; treated with prednisone , Flexeril, naproxen.  She was seen by her PCP on 03/29/2024; diagnosed with cervical disc disorder and degenerative disc disease; treated with prednisone .  The history is provided by the patient and medical records.    Past Medical History:  Diagnosis Date   Adrenal nodule 11/20/2021   Chickenpox    Genital warts    GERD (gastroesophageal reflux disease)    Hyperlipidemia    Urinary tract infection     Patient Active Problem List   Diagnosis Date Noted   Pancreatic insufficiency 05/15/2022   Controlled type 2 diabetes mellitus with hyperglycemia (HCC) 05/15/2022   Cervical radiculopathy, acute 11/25/2021   Chronic diarrhea    Polyp of cecum    Preventative health care 03/16/2018   Osteopenia 01/12/2018   VENEREAL WART 07/04/2007   Hyperlipidemia 07/04/2007   GERD 07/04/2007    Past Surgical History:   Procedure Laterality Date   CATARACT EXTRACTION Right 08/29/2019   CATARACT EXTRACTION Left 09/19/2019   COLONOSCOPY WITH ESOPHAGOGASTRODUODENOSCOPY (EGD)     COLONOSCOPY WITH PROPOFOL  N/A 11/04/2021   Procedure: COLONOSCOPY WITH PROPOFOL ;  Surgeon: Unk Corinn Skiff, MD;  Location: [facility] ENDOSCOPY;  Service: Gastroenterology;  Laterality: N/A;   ESOPHAGOGASTRODUODENOSCOPY (EGD) WITH PROPOFOL  N/A 11/04/2021   Procedure: ESOPHAGOGASTRODUODENOSCOPY (EGD) WITH PROPOFOL ;  Surgeon: Unk Corinn Skiff, MD;  Location: [facility] ENDOSCOPY;  Service: Gastroenterology;  Laterality: N/A;    OB History   No obstetric history on file.      Home Medications    Prior to Admission medications   Medication Sig Start Date End Date Taking? Authorizing Provider  traMADol (ULTRAM) 50 MG tablet Take 1 tablet (50 mg total) by mouth every 6 (six) hours as needed. 04/15/24  Yes Corlis Burnard DEL, NP  aspirin  EC 81 MG tablet Take 1 tablet (81 mg total) by mouth daily. Swallow whole. 02/06/22   Darliss Rogue, MD  atorvastatin  (LIPITOR) 40 MG tablet Take 1 tablet (40 mg total) by mouth daily. for cholesterol. 08/02/23   Clark, Katherine K, NP  CALCIUM  PO Take 1,000 mg by mouth daily.    [provider]  Cholecalciferol (VITAMIN D  PO) Take 1,000 Units by mouth daily.    [provider]  MAGNESIUM PO Take by mouth daily.    [provider]  omeprazole (PRILOSEC) 10 MG capsule Take 10 mg by mouth daily.    [provider]  predniSONE  (DELTASONE ) 20 MG tablet 2 tabs po daily for 5 days, then 1 tab po daily for 5 days Patient not taking: Reported on 04/15/2024 03/29/24   Watt Mirza, MD    Family History Family History  Problem Relation Age of Onset   Arthritis Mother    Diabetes Mother    Hearing loss Mother    Hypertension Mother    Kidney cancer Mother    Hearing loss Father    Stomach cancer Father    Asthma Brother    Heart Problems Brother    Breast cancer Neg Hx      Social History Social History   Tobacco Use   Smoking status: Former    Current packs/day: 0.00    Types: Cigarettes    Quit date: 06/15/1978    Years since quitting: 45.8   Smokeless tobacco: Never   Tobacco comments:    Stopping smoking 1980  Vaping Use   Vaping status: Never Used  Substance Use Topics   Alcohol use: Yes    Comment: 1 glass of wine per month   Drug use: Not Currently     Allergies   Codeine   Review of Systems Review of Systems  Constitutional:  Negative for chills and fever.  Musculoskeletal:  Positive for arthralgias and neck pain. Negative for joint swelling.  Skin:  Negative for color change, rash and wound.  Neurological:  Negative for weakness and numbness.     Physical Exam Triage Vital Signs ED Triage Vitals [04/15/24 0949]  Encounter Vitals Group     BP      Girls Systolic BP Percentile      Girls Diastolic BP Percentile      Boys Systolic BP Percentile      Boys Diastolic BP Percentile      Pulse      Resp      Temp      Temp src      SpO2      Weight      Height      Head Circumference      Peak Flow      Pain Score 10     Pain Loc      Pain Education      Exclude from Growth Chart    No data found.  Updated Vital Signs BP 138/82   Pulse 72   Temp 97.9 F (36.6 C)   Resp 18   SpO2 98%   Visual Acuity Right Eye Distance:   Left Eye Distance:   Bilateral Distance:    Right Eye Near:   Left Eye Near:    Bilateral Near:     Physical Exam Constitutional:      General: She is not in acute distress. HENT:     Mouth/Throat:     Mouth: Mucous membranes are moist.  Cardiovascular:     Rate and Rhythm: Normal rate and regular rhythm.     Heart sounds: Normal heart sounds.  Pulmonary:     Effort: Pulmonary effort is normal. No respiratory distress.     Breath sounds: Normal breath sounds.  Musculoskeletal:        General: No swelling, tenderness or deformity. Normal range of motion.     Comments: Neck,  shoulder, right upper extremity have full range of motion, strength 5/5, sensation intact, 2+ radial pulse, strong handgrip.  No wounds, erythema, ecchymosis.  Skin:    Capillary Refill: Capillary refill takes less than 2 seconds.     Findings: No bruising, erythema, lesion or rash.  Neurological:     General: No focal deficit present.     Mental Status: She is alert.     Sensory: No sensory deficit.     Motor: No weakness.      UC Treatments / Results  Labs (all labs ordered are listed, but only abnormal results are displayed) Labs Reviewed - No data to display  EKG   Radiology No results found.  Procedures Procedures (including critical care time)  Medications Ordered in UC Medications - No data to display  Initial Impression / Assessment and Plan / UC Course  I have reviewed the triage vital signs and the nursing notes.  Pertinent labs & imaging results that were available during my care of the patient were reviewed by me and considered in my medical decision making (see chart for details).    Cervical radiculopathy.  Afebrile and vital signs are stable.  No trauma.  Instructed patient to attempt treatment with Tylenol or ibuprofen first and if these are not helping, treating with tramadol (15 tablets prescribed).  Precautions for drowsiness with tramadol discussed.  Instructed her to follow-up with her sports medicine doctor on Monday.  ED precautions given.  Education provided on cervical radiculopathy.  She agrees to plan of care.  Final Clinical Impressions(s) / UC Diagnoses   Final diagnoses:  Cervical radiculopathy     Discharge Instructions      If your pain is not controlled with Tylenol or ibuprofen, take the tramadol as directed.  Do not drive, operate machinery, drink alcohol, or perform dangerous activities while taking this medication as it may cause drowsiness.  Follow-up with your sports medicine doctor on Monday.  Go to the emergency department if  you have worsening symptoms.     ED Prescriptions     Medication Sig Dispense Auth. Provider   traMADol (ULTRAM) 50 MG tablet Take 1 tablet (50 mg total) by mouth every 6 (six) hours as needed. 15 tablet Corlis Burnard DEL, NP      I have reviewed the PDMP during this encounter.   Corlis Burnard DEL, NP 04/15/24 1024

## 2024-04-15 NOTE — ED Triage Notes (Signed)
 Patient to Urgent Care with complaints of right sided neck pain/ stiffness. Radiates into her right elbow.   Reports hx of pinched nerve. States she re-aggravated it 4 weeks ago. Completed prednisone . Symptoms returned two nights ago.   Using heat.

## 2024-04-15 NOTE — Discharge Instructions (Addendum)
 If your pain is not controlled with Tylenol or ibuprofen, take the tramadol as directed.  Do not drive, operate machinery, drink alcohol, or perform dangerous activities while taking this medication as it may cause drowsiness.  Follow-up with your sports medicine doctor on Monday.  Go to the emergency department if you have worsening symptoms.

## 2024-04-17 ENCOUNTER — Other Ambulatory Visit (HOSPITAL_COMMUNITY): Payer: Self-pay

## 2024-04-17 MED ORDER — PREDNISONE 20 MG PO TABS
ORAL_TABLET | ORAL | 0 refills | Status: AC
Start: 2024-04-17 — End: ?
  Filled 2024-04-17: qty 15, 10d supply, fill #0

## 2024-04-17 MED ORDER — HYDROCODONE-ACETAMINOPHEN 5-325 MG PO TABS
0.5000 | ORAL_TABLET | Freq: Four times a day (QID) | ORAL | 0 refills | Status: AC | PRN
  Filled 2024-04-17 (×2): qty 20, 5d supply, fill #0

## 2024-04-17 NOTE — Addendum Note (Signed)
 Addended by: WATT MIRZA on: 04/17/2024 01:39 PM   Modules accepted: Orders

## 2024-04-17 NOTE — Telephone Encounter (Signed)
 Can you help reach out to the patient for follow-up with me on Wed?  Thanks.

## 2024-04-17 NOTE — Telephone Encounter (Signed)
 Called patient set up office visit as requested. She wanted to make sure you seen the message she sent about medication as well.

## 2024-04-21 ENCOUNTER — Other Ambulatory Visit: Payer: Self-pay | Admitting: Primary Care

## 2024-04-21 ENCOUNTER — Other Ambulatory Visit (HOSPITAL_COMMUNITY): Payer: Self-pay

## 2024-04-21 DIAGNOSIS — E785 Hyperlipidemia, unspecified: Secondary | ICD-10-CM

## 2024-04-21 MED ORDER — ATORVASTATIN CALCIUM 40 MG PO TABS
40.0000 mg | ORAL_TABLET | Freq: Every day | ORAL | 0 refills | Status: DC
  Filled 2024-04-21: qty 90, 90d supply, fill #0

## 2024-04-21 NOTE — Telephone Encounter (Signed)
Patient is due for CPE/follow up in early December, this will be required prior to any further refills.  Please schedule, thank you!

## 2024-04-23 NOTE — Progress Notes (Signed)
     Kayla Ritter T. Davida Falconi, MD, CAQ Sports Medicine [facility] [address] [place], [address]  Phone: [phone]  FAX: [phone]  Kayla Ritter - 73 y.o. female  MRN [id number]  Date of Birth: [birth year]  Date: 04/24/2024  PCP: Gretta Comer POUR, NP  Referral: Gretta Comer POUR, NP  No chief complaint on file.  Subjective:   Kayla Ritter is a 73 y.o. very pleasant female patient with There is no height or weight on file to calculate BMI. who presents with the following:  Discussed the use of AI scribe software for clinical note transcription with the patient, who gave verbal consent to proceed.  Send is here for ongoing neck and shoulder pain.  I last her on her on March 29, 2024, and she went to the emergency room on March 25, 2024 and also to urgent care on April 15, 2024.  Previously, she has been on steroids multiple times, she has also had physical therapy multiple times. - She recently sent me a message last week, and I sent her in some prednisone  and Vicodin.  She was unable to tolerate the tramadol that I recently gave her.  MRI of the cervical spine from Oct 21, 2020 does show extensive multilevel cervical disease disease as well as moderate spinal stenosis mass cord effect, severe neuroforaminal stenosis at C4, C5-C6, and C7.  History of Present Illness     Review of Systems is noted in the HPI, as appropriate  Objective:   There were no vitals taken for this visit.  GEN: No acute distress; alert,appropriate. PULM: Breathing comfortably in no respiratory distress PSYCH: Normally interactive.   Laboratory and Imaging Data:  Assessment and Plan:   No diagnosis found. Assessment & Plan   Medication Management during today's office visit: No orders of the defined types were placed in this encounter.  There are no discontinued medications.  Orders placed today for conditions managed today: No orders of the  defined types were placed in this encounter.   Disposition: No follow-ups on file.  [employer] One speech-to-text software was used for transcription in this dictation.  Possible transcriptional errors can occur using Animal nutritionist.   Signed,  Jacques DASEN. Hershell Brandl, MD   Outpatient Encounter Medications as of 04/24/2024  Medication Sig   aspirin  EC 81 MG tablet Take 1 tablet (81 mg total) by mouth daily. Swallow whole.   atorvastatin  (LIPITOR) 40 MG tablet Take 1 tablet (40 mg total) by mouth daily. for cholesterol.   CALCIUM  PO Take 1,000 mg by mouth daily.   Cholecalciferol (VITAMIN D  PO) Take 1,000 Units by mouth daily.   HYDROcodone-acetaminophen (NORCO/VICODIN) 5-325 MG tablet Take 0.5-1 tablets by mouth every 6 (six) hours as needed for severe pain (pain score 7-10).   MAGNESIUM PO Take by mouth daily.   omeprazole (PRILOSEC) 10 MG capsule Take 10 mg by mouth daily.   predniSONE  (DELTASONE ) 20 MG tablet Take 2 tablets (40 mg) by mouth daily for 5 days, then take 1 tablet (20 mg) by mouth daily for 5 days   No facility-administered encounter medications on file as of 04/24/2024.

## 2024-04-24 ENCOUNTER — Encounter: Payer: Self-pay | Admitting: Family Medicine

## 2024-04-24 ENCOUNTER — Ambulatory Visit (INDEPENDENT_AMBULATORY_CARE_PROVIDER_SITE_OTHER): Admitting: Family Medicine

## 2024-04-24 ENCOUNTER — Other Ambulatory Visit: Payer: Self-pay

## 2024-04-24 VITALS — BP 122/70 | HR 69 | Temp 98.4°F | Ht 58.5 in | Wt 111.4 lb

## 2024-04-24 DIAGNOSIS — M5010 Cervical disc disorder with radiculopathy, unspecified cervical region: Secondary | ICD-10-CM | POA: Diagnosis not present

## 2024-04-24 NOTE — Telephone Encounter (Signed)
 Please help the patient get set up with PT at [facility].  Consult placed last week.

## 2024-04-27 ENCOUNTER — Telehealth: Payer: Self-pay | Admitting: Primary Care

## 2024-04-27 DIAGNOSIS — M542 Cervicalgia: Secondary | ICD-10-CM | POA: Diagnosis not present

## 2024-04-27 DIAGNOSIS — R293 Abnormal posture: Secondary | ICD-10-CM | POA: Diagnosis not present

## 2024-04-27 NOTE — Telephone Encounter (Signed)
 Per chart review a referral was placed on 04/17/2024 by Dr. Watt.  I am not sure what else they need.

## 2024-04-27 NOTE — Telephone Encounter (Signed)
 Copied from CRM [id number]. Topic: General - Call Back - No Documentation >> Apr 26, 2024  4:27 PM Rea C wrote: Reason for CRM: [facility] called and stated that patient has an appointment tomorrow with them but they need a referral sent to them for patient.   Heather from [facility]   Phone: [phone] Fax: [phone]

## 2024-04-28 NOTE — Telephone Encounter (Signed)
 Noted

## 2024-04-28 NOTE — Telephone Encounter (Signed)
 This was already faxed but I will refax it to them now

## 2024-05-01 DIAGNOSIS — R293 Abnormal posture: Secondary | ICD-10-CM | POA: Diagnosis not present

## 2024-05-01 DIAGNOSIS — M542 Cervicalgia: Secondary | ICD-10-CM | POA: Diagnosis not present

## 2024-05-03 DIAGNOSIS — R293 Abnormal posture: Secondary | ICD-10-CM | POA: Diagnosis not present

## 2024-05-03 DIAGNOSIS — M542 Cervicalgia: Secondary | ICD-10-CM | POA: Diagnosis not present

## 2024-05-08 DIAGNOSIS — M542 Cervicalgia: Secondary | ICD-10-CM | POA: Diagnosis not present

## 2024-05-08 DIAGNOSIS — R293 Abnormal posture: Secondary | ICD-10-CM | POA: Diagnosis not present

## 2024-05-10 DIAGNOSIS — M542 Cervicalgia: Secondary | ICD-10-CM | POA: Diagnosis not present

## 2024-05-10 DIAGNOSIS — R293 Abnormal posture: Secondary | ICD-10-CM | POA: Diagnosis not present

## 2024-05-16 DIAGNOSIS — M542 Cervicalgia: Secondary | ICD-10-CM | POA: Diagnosis not present

## 2024-05-16 DIAGNOSIS — R293 Abnormal posture: Secondary | ICD-10-CM | POA: Diagnosis not present

## 2024-05-19 DIAGNOSIS — R293 Abnormal posture: Secondary | ICD-10-CM | POA: Diagnosis not present

## 2024-05-19 DIAGNOSIS — M542 Cervicalgia: Secondary | ICD-10-CM | POA: Diagnosis not present

## 2024-05-22 DIAGNOSIS — M542 Cervicalgia: Secondary | ICD-10-CM | POA: Diagnosis not present

## 2024-05-22 DIAGNOSIS — R293 Abnormal posture: Secondary | ICD-10-CM | POA: Diagnosis not present

## 2024-05-24 DIAGNOSIS — R293 Abnormal posture: Secondary | ICD-10-CM | POA: Diagnosis not present

## 2024-05-24 DIAGNOSIS — M542 Cervicalgia: Secondary | ICD-10-CM | POA: Diagnosis not present

## 2024-05-25 ENCOUNTER — Ambulatory Visit: Admitting: Primary Care

## 2024-05-25 ENCOUNTER — Ambulatory Visit: Payer: Self-pay | Admitting: Primary Care

## 2024-05-25 ENCOUNTER — Encounter: Payer: Self-pay | Admitting: Primary Care

## 2024-05-25 VITALS — BP 124/62 | HR 68 | Temp 97.8°F | Ht 58.5 in | Wt 111.4 lb

## 2024-05-25 DIAGNOSIS — Z0000 Encounter for general adult medical examination without abnormal findings: Secondary | ICD-10-CM

## 2024-05-25 DIAGNOSIS — E785 Hyperlipidemia, unspecified: Secondary | ICD-10-CM | POA: Diagnosis not present

## 2024-05-25 DIAGNOSIS — E1165 Type 2 diabetes mellitus with hyperglycemia: Secondary | ICD-10-CM

## 2024-05-25 DIAGNOSIS — K219 Gastro-esophageal reflux disease without esophagitis: Secondary | ICD-10-CM

## 2024-05-25 LAB — COMPREHENSIVE METABOLIC PANEL WITH GFR
ALT: 16 U/L (ref 0–35)
AST: 22 U/L (ref 0–37)
Albumin: 4.1 g/dL (ref 3.5–5.2)
Alkaline Phosphatase: 55 U/L (ref 39–117)
BUN: 9 mg/dL (ref 6–23)
CO2: 33 meq/L — ABNORMAL HIGH (ref 19–32)
Calcium: 9.4 mg/dL (ref 8.4–10.5)
Chloride: 103 meq/L (ref 96–112)
Creatinine, Ser: 0.73 mg/dL (ref 0.40–1.20)
GFR: 81.54 mL/min (ref >60.00)
Glucose, Bld: 98 mg/dL (ref 70–99)
Potassium: 3.8 meq/L (ref 3.5–5.1)
Sodium: 141 meq/L (ref 135–145)
Total Bilirubin: 0.8 mg/dL (ref 0.2–1.2)
Total Protein: 6.2 g/dL (ref 6.0–8.3)

## 2024-05-25 LAB — LIPID PANEL
Cholesterol: 136 mg/dL (ref 0–200)
HDL: 48.7 mg/dL (ref >39.00)
LDL Cholesterol: 66 mg/dL (ref 0–99)
NonHDL: 87.14
Total CHOL/HDL Ratio: 3
Triglycerides: 107 mg/dL (ref 0.0–149.0)
VLDL: 21.4 mg/dL (ref 0.0–40.0)

## 2024-05-25 LAB — HEMOGLOBIN A1C: Hgb A1c MFr Bld: 6.6 % — ABNORMAL HIGH (ref 4.6–6.5)

## 2024-05-25 NOTE — Progress Notes (Signed)
 Subjective:    Patient ID: Kayla Ritter, female    DOB: [birth year], 73 y.o.   MRN: [id number]  Kayla Ritter is a very pleasant 73 y.o. female who presents today for complete physical and follow up of chronic conditions.  Immunizations: -Tetanus: Completed in 2013 -Influenza: Declines influenza vaccine.  -Shingles: Completed Shingrix series -Pneumonia: Completed in 2020   Diet: Fair diet.  Exercise: No regular exercise.  Eye exam: Completes annually  Dental exam: Completes semi-annually    Mammogram: Completed in March and October 2025 Bone Density Scan: Completed in March 2025  Colonoscopy: Completed in 2023, due 2028  BP Readings from Last 3 Encounters:  05/25/24 124/62  04/24/24 122/70  04/15/24 138/82       Review of Systems  Constitutional:  Negative for unexpected weight change.  HENT:  Negative for rhinorrhea.   Respiratory:  Negative for cough and shortness of breath.   Cardiovascular:  Negative for chest pain.  Gastrointestinal:  Negative for constipation and diarrhea.  Genitourinary:  Negative for difficulty urinating.  Musculoskeletal:  Negative for arthralgias and myalgias.  Skin:  Negative for rash.  Allergic/Immunologic: Negative for environmental allergies.  Neurological:  Negative for dizziness and headaches.  Psychiatric/Behavioral:  The patient is not nervous/anxious.          Past Medical History:  Diagnosis Date   Adrenal nodule 11/20/2021   Chickenpox    Genital warts    GERD (gastroesophageal reflux disease)    Hyperlipidemia    Urinary tract infection     Social History   Socioeconomic History   Marital status: Married    Spouse name: Not on file   Number of children: Not on file   Years of education: Not on file   Highest education level: Some college, no degree  Occupational History   Not on file  Tobacco Use   Smoking status: Former    Current packs/day: 0.00    Average packs/day: 1 pack/day for 5.0 years (5.0 ttl  pk-yrs)    Types: Cigarettes    Quit date: 12/13/1981    Years since quitting: 42.4   Smokeless tobacco: Never   Tobacco comments:    Stopping smoking 1980  Vaping Use   Vaping status: Never Used  Substance and Sexual Activity   Alcohol use: Yes    Alcohol/week: 1.0 standard drink of alcohol    Types: 1 Glasses of wine per week    Comment: 1 glass of wine per month   Drug use: Not Currently   Sexual activity: Not Currently  Other Topics Concern   Not on file  Social History Narrative   ** Merged History Encounter **       Married. 4 children, 5 grandchildren.  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  Enjoys reading, crossword puzzles, spending time with grandchildren.    Social Drivers of Health   Tobacco Use: Medium Risk (05/25/2024)   Patient History    Smoking Tobacco Use: Former    Smokeless Tobacco Use: Never    Passive Exposure: Not on file  Financial Resource Strain: Low Risk (03/24/2024)   Overall Financial Resource Strain (CARDIA)    Difficulty of Paying Living Expenses: Not hard at all  Food Insecurity: No Food Insecurity (03/24/2024)   Epic    Worried About Programme Researcher, Broadcasting/film/video in the Last Year: Never true    Ran Out of Food in the Last Year: Never true  Transportation Needs: No Transportation Needs (  03/24/2024)   Epic    Lack of Transportation (Medical): No    Lack of Transportation (Non-Medical): No  Physical Activity: Insufficiently Active (03/24/2024)   Exercise Vital Sign    Days of Exercise per Week: 3 days    Minutes of Exercise per Session: 30 min  Stress: No Stress Concern Present (03/24/2024)   [employer] of Occupational Health - Occupational Stress Questionnaire    Feeling of Stress: Not at all  Social Connections: Socially Integrated (03/24/2024)   Social Connection and Isolation Panel    Frequency of Communication with Friends and Family: More than three times a week    Frequency of Social Gatherings with  Friends and Family: More than three times a week    Attends Religious Services: More than 4 times per year    Active Member of Clubs or Organizations: Yes    Attends Banker Meetings: More than 4 times per year    Marital Status: Married  Catering Manager Violence: Not At Risk (05/10/2023)   Humiliation, Afraid, Rape, and Kick questionnaire    Fear of Current or Ex-Partner: No    Emotionally Abused: No    Physically Abused: No    Sexually Abused: No  Depression (PHQ2-9): Low Risk (05/25/2024)   Depression (PHQ2-9)    PHQ-2 Score: 0  Alcohol Screen: Low Risk (03/24/2024)   Alcohol Screen    Last Alcohol Screening Score (AUDIT): 2  Housing: Low Risk (03/24/2024)   Epic    Unable to Pay for Housing in the Last Year: No    Number of Times Moved in the Last Year: 0    Homeless in the Last Year: No  Utilities: Not At Risk (05/10/2023)   AHC Utilities    Threatened with loss of utilities: No  Health Literacy: Adequate Health Literacy (05/10/2023)   B1300 Health Literacy    Frequency of need for help with medical instructions: Never    Past Surgical History:  Procedure Laterality Date   CATARACT EXTRACTION Right 08/29/2019   CATARACT EXTRACTION Left 09/19/2019   COLONOSCOPY WITH ESOPHAGOGASTRODUODENOSCOPY (EGD)     COLONOSCOPY WITH PROPOFOL  N/A 11/04/2021   Procedure: COLONOSCOPY WITH PROPOFOL ;  Surgeon: Unk Corinn Skiff, MD;  Location[facility] ENDOSCOPY;  Service: Gastroenterology;  Laterality: N/A;   ESOPHAGOGASTRODUODENOSCOPY (EGD) WITH PROPOFOL  N/A 11/04/2021   Procedure: ESOPHAGOGASTRODUODENOSCOPY (EGD) WITH PROPOFOL ;  Surgeon: Unk Corinn Skiff, MD;  Location: [facility] ENDOSCOPY;  Service: Gastroenterology;  Laterality: N/A;    Family History  Problem Relation Age of Onset   Arthritis Mother    Diabetes Mother    Hearing loss Mother    Hypertension Mother    Kidney cancer Mother    Hearing loss Father    Stomach cancer Father    Asthma Brother    Heart  Problems Brother    Breast cancer Neg Hx     Allergies[1]  Medications Ordered Prior to Encounter[2]  BP 124/62   Pulse 68   Temp 97.8 F (36.6 C) (Oral)   Ht 4' 10.5 (1.486 m)   Wt 111 lb 6 oz (50.5 kg)   SpO2 97%   BMI 22.88 kg/m  Objective:   Physical Exam HENT:     Right Ear: Tympanic membrane and ear canal normal.     Left Ear: Tympanic membrane and ear canal normal.  Eyes:     Pupils: Pupils are equal, round, and reactive to light.  Cardiovascular:     Rate and Rhythm: Normal rate and regular rhythm.  Pulmonary:     Effort: Pulmonary effort is normal.     Breath sounds: Normal breath sounds.  Abdominal:     General: Bowel sounds are normal.     Palpations: Abdomen is soft.     Tenderness: There is no abdominal tenderness.  Musculoskeletal:        General: Normal range of motion.     Cervical back: Neck supple.  Skin:    General: Skin is warm and dry.  Neurological:     Mental Status: She is alert and oriented to person, place, and time.     Cranial Nerves: No cranial nerve deficit.     Deep Tendon Reflexes:     Reflex Scores:      Patellar reflexes are 2+ on the right side and 2+ on the left side. Psychiatric:        Mood and Affect: Mood normal.     Physical Exam        Assessment & Plan:  Preventative health care Assessment & Plan: Immunizations UTD. Declines influenza vaccine.  Mammogram UTD Bone density scan UTD Colonoscopy UTD, due 2028  Discussed the importance of a healthy diet and regular exercise in order for weight loss, and to reduce the risk of further co-morbidity.  Exam stable. Labs pending.  Follow up in 1 year for repeat physical.    Gastroesophageal reflux disease, unspecified whether esophagitis present Assessment & Plan: Controlled.  Continue omeprazole 10 mg daily.   Controlled type 2 diabetes mellitus with hyperglycemia (HCC) Assessment & Plan: Repeat A1C pending.  Work on a healthy diet and regular  exercise in order for weight loss, and to reduce the risk of further co-morbidity. Remain off treatment.   Orders: -     Hemoglobin A1c -     Comprehensive metabolic panel with GFR  Hyperlipidemia, unspecified hyperlipidemia type Assessment & Plan: Repeat lipid panel pending.  Work on a healthy diet and regular exercise in order for weight loss, and to reduce the risk of further co-morbidity. Continue atorvastatin  40 mg daily.  Orders: -     Lipid panel -     Comprehensive metabolic panel with GFR    Assessment and Plan Assessment & Plan         Comer MARLA Gaskins, NP       [1]  Allergies Allergen Reactions   Codeine     REACTION: N \\T \ V   Tramadol  Hcl Nausea Only  [2]  Current Outpatient Medications on File Prior to Visit  Medication Sig Dispense Refill   aspirin  EC 81 MG tablet Take 1 tablet (81 mg total) by mouth daily. Swallow whole.     atorvastatin  (LIPITOR) 40 MG tablet Take 1 tablet (40 mg total) by mouth daily. for cholesterol. 90 tablet 0   CALCIUM  PO Take 1,000 mg by mouth daily.     Cholecalciferol (VITAMIN D  PO) Take 1,000 Units by mouth daily.     HYDROcodone -acetaminophen  (NORCO/VICODIN) 5-325 MG tablet Take 0.5-1 tablets by mouth every 6 (six) hours as needed for severe pain (pain score 7-10). 20 tablet 0   MAGNESIUM PO Take by mouth daily.     omeprazole (PRILOSEC) 10 MG capsule Take 10 mg by mouth daily.     No current facility-administered medications on file prior to visit.

## 2024-05-25 NOTE — Patient Instructions (Signed)
 Stop by the lab prior to leaving today. I will notify you of your results once received.   It was a pleasure to see you today!

## 2024-05-25 NOTE — Assessment & Plan Note (Signed)
 Immunizations UTD. Declines influenza vaccine.  Mammogram UTD Bone density scan UTD Colonoscopy UTD, due 2028  Discussed the importance of a healthy diet and regular exercise in order for weight loss, and to reduce the risk of further co-morbidity.  Exam stable. Labs pending.  Follow up in 1 year for repeat physical.

## 2024-05-25 NOTE — Assessment & Plan Note (Signed)
Controlled.  Continue omeprazole 10 mg daily.

## 2024-05-25 NOTE — Assessment & Plan Note (Signed)
 Repeat lipid panel pending.  Work on a healthy diet and regular exercise in order for weight loss, and to reduce the risk of further co-morbidity. Continue atorvastatin  40 mg daily.

## 2024-05-25 NOTE — Assessment & Plan Note (Signed)
 Repeat A1C pending.  Work on a healthy diet and regular exercise in order for weight loss, and to reduce the risk of further co-morbidity. Remain off treatment.

## 2024-06-14 ENCOUNTER — Ambulatory Visit (INDEPENDENT_AMBULATORY_CARE_PROVIDER_SITE_OTHER)

## 2024-06-14 VITALS — BP 124/62 | Ht 58.5 in | Wt 111.0 lb

## 2024-06-14 DIAGNOSIS — Z0000 Encounter for general adult medical examination without abnormal findings: Secondary | ICD-10-CM

## 2024-06-14 NOTE — Patient Instructions (Signed)
 Kayla Ritter,  Thank you for taking the time for your Medicare Wellness Visit. I appreciate your continued commitment to your health goals. Please review the care plan we discussed, and feel free to reach out if I can assist you further.  Please note that Annual Wellness Visits do not include a physical exam. Some assessments may be limited, especially if the visit was conducted virtually. If needed, we may recommend an in-person follow-up with your provider.  Ongoing Care Seeing your primary care provider every 3 to 6 months helps us  monitor your health and provide consistent, personalized care.   Referrals If a referral was made during today's visit and you haven't received any updates within two weeks, please contact the referred provider directly to check on the status.  Recommended Screenings:  Health Maintenance  Topic Date Due   Yearly kidney health urinalysis for diabetes  Never done   Zoster (Shingles) Vaccine (2 of 2) 10/08/2018   Complete foot exam   06/28/2020   Eye exam for diabetics  09/13/2020   DTaP/Tdap/Td vaccine (2 - Tdap) 06/15/2021   COVID-19 Vaccine (3 - 2025-26 season) 02/14/2024   Medicare Annual Wellness Visit  05/09/2024   Flu Shot  09/12/2024*   Hemoglobin A1C  11/23/2024   Yearly kidney function blood test for diabetes  05/25/2025   Breast Cancer Screening  08/22/2025   Colon Cancer Screening  11/05/2026   Pneumococcal Vaccine for age over 58  Completed   Osteoporosis screening with Bone Density Scan  Completed   Hepatitis C Screening  Completed   Meningitis B Vaccine  Aged Out  *Topic was postponed. The date shown is not the original due date.       06/14/2024   10:23 AM  Advanced Directives  Does Patient Have a Medical Advance Directive? Yes  Type of Estate Agent of [place];Living will  Does patient want to make changes to medical advance directive? No - Patient declined  Copy of Healthcare Power of Attorney in Chart? Yes -  validated most recent copy scanned in chart (See row information)    Vision: Annual vision screenings are recommended for early detection of glaucoma, cataracts, and diabetic retinopathy. These exams can also reveal signs of chronic conditions such as diabetes and high blood pressure.  Dental: Annual dental screenings help detect early signs of oral cancer, gum disease, and other conditions linked to overall health, including heart disease and diabetes.  Please see the attached documents for additional preventive care recommendations.

## 2024-06-14 NOTE — Progress Notes (Signed)
 " I connected with  Montie LITTIE Gentry on 06/14/2024 by a audio enabled telemedicine application and verified that I am speaking with the correct person using two identifiers.  Patient Location: Home  Provider Location: Home Office  Persons Participating in Visit: Patient.  I discussed the limitations of evaluation and management by telemedicine. The patient expressed understanding and agreed to proceed.  Vital Signs: Because this visit was a virtual/telehealth visit, some criteria may be missing or patient reported. Any vitals not documented were not able to be obtained and vitals that have been documented are patient reported.  Chief Complaint  Patient presents with   Medicare Wellness     Subjective:   Kayla Ritter is a 73 y.o. female who presents for a Medicare Annual Wellness Visit.  Visit info / Clinical Intake: Medicare Wellness Visit Type:: Subsequent Annual Wellness Visit Persons participating in visit and providing information:: patient Medicare Wellness Visit Mode:: Telephone If telephone:: video declined Since this visit was completed virtually, some vitals may be partially provided or unavailable. Missing vitals are due to the limitations of the virtual format.: Documented vitals are patient reported If Telephone or Video please confirm:: I connected with patient using audio/video enable telemedicine. I verified patient identity with two identifiers, discussed telehealth limitations, and patient agreed to proceed. Patient Location:: home Provider Location:: home office Interpreter Needed?: No Pre-visit prep was completed: yes AWV questionnaire completed by patient prior to visit?: yes Date:: 06/14/24 Living arrangements:: (Patient-Rptd) lives with spouse/significant other; with family/others Patient's Overall Health Status Rating: (Patient-Rptd) very good Typical amount of pain: (Patient-Rptd) none Does pain affect daily life?: (Patient-Rptd) no  Dietary Habits and  Nutritional Risks How many meals a day?: (!) (Patient-Rptd) 1 Eats fruit and vegetables daily?: (Patient-Rptd) yes Most meals are obtained by: (Patient-Rptd) preparing own meals In the last 2 weeks, have you had any of the following?: none Diabetic:: no  Functional Status Activities of Daily Living (to include ambulation/medication): (Patient-Rptd) Independent Ambulation: (Patient-Rptd) Independent Medication Administration: (Patient-Rptd) Independent Home Management (perform basic housework or laundry): (Patient-Rptd) Independent Manage your own finances?: (Patient-Rptd) yes Primary transportation is: (Patient-Rptd) driving Concerns about vision?: no *vision screening is required for WTM* Concerns about hearing?: no  Fall Screening Falls in the past year?: (Patient-Rptd) 0 Number of falls in past year: 0 Was there an injury with Fall?: 0 Fall Risk Category Calculator: 0 Patient Fall Risk Level: Low Fall Risk  Fall Risk Patient at Risk for Falls Due to: No Fall Risks Fall risk Follow up: Falls evaluation completed  Home and Transportation Safety: All rugs have non-skid backing?: yes All stairs or steps have railings?: (!) (Patient-Rptd) no Grab bars in the bathtub or shower?: (Patient-Rptd) yes Have non-skid surface in bathtub or shower?: (Patient-Rptd) yes Good home lighting?: (Patient-Rptd) yes Regular seat belt use?: (Patient-Rptd) yes Hospital stays in the last year:: (Patient-Rptd) no  Cognitive Assessment Difficulty concentrating, remembering, or making decisions? : (Patient-Rptd) no Will 6CIT or Mini Cog be Completed: yes What year is it?: 0 points What month is it?: 0 points Give patient an address phrase to remember (5 components): remember words apple , table , penny About what time is it?: 0 points Count backwards from 20 to 1: 0 points Say the months of the year in reverse: 0 points Repeat the address phrase from earlier: 0 points 6 CIT Score: 0  points  Advance Directives (For Healthcare) Does Patient Have a Medical Advance Directive?: Yes Does patient want to make changes to medical  advance directive?: No - Patient declined Type of Advance Directive: Healthcare Power of [place]; Living will Copy of Healthcare Power of Attorney in Chart?: Yes - validated most recent copy scanned in chart (See row information) Copy of Living Will in Chart?: Yes - validated most recent copy scanned in chart (See row information)  Reviewed/Updated  Reviewed/Updated: Reviewed All (Medical, Surgical, Family, Medications, Allergies, Care Teams, Patient Goals)    Allergies (verified) Codeine and Tramadol  hcl   Current Medications (verified) Outpatient Encounter Medications as of 06/14/2024  Medication Sig   aspirin  EC 81 MG tablet Take 1 tablet (81 mg total) by mouth daily. Swallow whole.   atorvastatin  (LIPITOR) 40 MG tablet Take 1 tablet (40 mg total) by mouth daily. for cholesterol.   CALCIUM  PO Take 1,000 mg by mouth daily.   Cholecalciferol (VITAMIN D  PO) Take 1,000 Units by mouth daily.   HYDROcodone -acetaminophen  (NORCO/VICODIN) 5-325 MG tablet Take 0.5-1 tablets by mouth every 6 (six) hours as needed for severe pain (pain score 7-10).   MAGNESIUM PO Take by mouth daily.   omeprazole (PRILOSEC) 10 MG capsule Take 10 mg by mouth daily.   No facility-administered encounter medications on file as of 06/14/2024.    History: Past Medical History:  Diagnosis Date   Adrenal nodule 11/20/2021   Chickenpox    Genital warts    GERD (gastroesophageal reflux disease)    Hyperlipidemia    Urinary tract infection    Past Surgical History:  Procedure Laterality Date   CATARACT EXTRACTION Right 08/29/2019   CATARACT EXTRACTION Left 09/19/2019   COLONOSCOPY WITH ESOPHAGOGASTRODUODENOSCOPY (EGD)     COLONOSCOPY WITH PROPOFOL  N/A 11/04/2021   Procedure: COLONOSCOPY WITH PROPOFOL ;  Surgeon: Unk Corinn Skiff, MD;  Location: [facility] ENDOSCOPY;   Service: Gastroenterology;  Laterality: N/A;   ESOPHAGOGASTRODUODENOSCOPY (EGD) WITH PROPOFOL  N/A 11/04/2021   Procedure: ESOPHAGOGASTRODUODENOSCOPY (EGD) WITH PROPOFOL ;  Surgeon: Unk Corinn Skiff, MD;  Location: [facility] ENDOSCOPY;  Service: Gastroenterology;  Laterality: N/A;   Family History  Problem Relation Age of Onset   Arthritis Mother    Diabetes Mother    Hearing loss Mother    Hypertension Mother    Kidney cancer Mother    Hearing loss Father    Stomach cancer Father    Asthma Brother    Heart Problems Brother    Breast cancer Neg Hx    Social History   Occupational History   Not on file  Tobacco Use   Smoking status: Former    Current packs/day: 0.00    Average packs/day: 1 pack/day for 5.0 years (5.0 ttl pk-yrs)    Types: Cigarettes    Quit date: 12/13/1981    Years since quitting: 42.5   Smokeless tobacco: Never   Tobacco comments:    Stopping smoking 1980  Vaping Use   Vaping status: Never Used  Substance and Sexual Activity   Alcohol use: Yes    Alcohol/week: 1.0 standard drink of alcohol    Types: 1 Glasses of wine per week    Comment: 1 glass of wine per month   Drug use: Not Currently   Sexual activity: Not Currently   Tobacco Counseling Counseling given: Not Answered Tobacco comments: Stopping smoking 1980  SDOH Screenings   Food Insecurity: No Food Insecurity (06/14/2024)  Housing: Low Risk (06/14/2024)  Transportation Needs: No Transportation Needs (06/14/2024)  Utilities: Not At Risk (06/14/2024)  Alcohol Screen: Low Risk (03/24/2024)  Depression (PHQ2-9): Low Risk (06/14/2024)  Financial Resource Strain: Low Risk (03/24/2024)  Physical  Activity: Sufficiently Active (06/14/2024)  Recent Concern: Physical Activity - Insufficiently Active (03/24/2024)  Social Connections: Socially Integrated (06/14/2024)  Stress: No Stress Concern Present (06/14/2024)  Tobacco Use: Medium Risk (06/14/2024)  Health Literacy: Adequate Health Literacy  (06/14/2024)   See flowsheets for full screening details  Depression Screen PHQ 2 & 9 Depression Scale- Over the past 2 weeks, how often have you been bothered by any of the following problems? Little interest or pleasure in doing things: 0 Feeling down, depressed, or hopeless (PHQ Adolescent also includes...irritable): 0 PHQ-2 Total Score: 0 Trouble falling or staying asleep, or sleeping too much: 0 Feeling tired or having little energy: 0 Poor appetite or overeating (PHQ Adolescent also includes...weight loss): 0 Feeling bad about yourself - or that you are a failure or have let yourself or your family down: 0 Trouble concentrating on things, such as reading the newspaper or watching television (PHQ Adolescent also includes...like school work): 0 Moving or speaking so slowly that other people could have noticed. Or the opposite - being so fidgety or restless that you have been moving around a lot more than usual: 0 Thoughts that you would be better off dead, or of hurting yourself in some way: 0 PHQ-9 Total Score: 0     Goals Addressed               This Visit's Progress     Patient Stated (pt-stated)        Patient would like to physically would like to get more strength.             Objective:    Today's Vitals   06/14/24 1104  BP: 124/62  Weight: 111 lb (50.3 kg)  Height: 4' 10.5 (1.486 m)   Body mass index is 22.8 kg/m.  Hearing/Vision screen Hearing Screening - Comments:: No difficulties  Vision Screening - Comments:: Patient sees [facility] and see dr.graham Immunizations and Health Maintenance Health Maintenance  Topic Date Due   Diabetic kidney evaluation - Urine ACR  Never done   Zoster Vaccines- Shingrix (2 of 2) 10/08/2018   FOOT EXAM  06/28/2020   OPHTHALMOLOGY EXAM  09/13/2020   DTaP/Tdap/Td (2 - Tdap) 06/15/2021   COVID-19 Vaccine (3 - 2025-26 season) 02/14/2024   Medicare Annual Wellness (AWV)  05/09/2024   Influenza Vaccine   09/12/2024 (Originally 01/14/2024)   HEMOGLOBIN A1C  11/23/2024   Diabetic kidney evaluation - eGFR measurement  05/25/2025   Mammogram  08/22/2025   Colonoscopy  11/05/2026   Pneumococcal Vaccine: 50+ Years  Completed   Bone Density Scan  Completed   Hepatitis C Screening  Completed   Meningococcal B Vaccine  Aged Out        Assessment/Plan:  This is a routine wellness examination for Tajanay.  Patient Care Team: Gretta Comer POUR, NP as PCP - General (Internal Medicine)  I have personally reviewed and noted the following in the patients chart:   Medical and social history Use of alcohol, tobacco or illicit drugs  Current medications and supplements including opioid prescriptions. Functional ability and status Nutritional status Physical activity Advanced directives List of other physicians Hospitalizations, surgeries, and ER visits in previous 12 months Vitals Screenings to include cognitive, depression, and falls Referrals and appointments  No orders of the defined types were placed in this encounter.  In addition, I have reviewed and discussed with patient certain preventive protocols, quality metrics, and best practice recommendations. A written personalized care plan for preventive services as well as  general preventive health recommendations were provided to patient.   Lyle MARLA Right, [place]   06/14/2024   No follow-ups on file.  After Visit Summary: (MyChart) Due to this being a telephonic visit, the after visit summary with patients personalized plan was offered to patient via MyChart   No voiced or noted concerns at this time  HM Addressed: patient declined HM at this time   "

## 2024-06-16 ENCOUNTER — Other Ambulatory Visit: Payer: Self-pay | Admitting: Primary Care

## 2024-06-16 DIAGNOSIS — E2839 Other primary ovarian failure: Secondary | ICD-10-CM

## 2024-06-16 DIAGNOSIS — N63 Unspecified lump in unspecified breast: Secondary | ICD-10-CM

## 2024-07-16 ENCOUNTER — Other Ambulatory Visit: Payer: Self-pay | Admitting: Primary Care

## 2024-07-16 ENCOUNTER — Other Ambulatory Visit (HOSPITAL_COMMUNITY): Payer: Self-pay

## 2024-07-16 DIAGNOSIS — E785 Hyperlipidemia, unspecified: Secondary | ICD-10-CM

## 2024-07-16 MED ORDER — ATORVASTATIN CALCIUM 40 MG PO TABS
40.0000 mg | ORAL_TABLET | Freq: Every day | ORAL | 2 refills | Status: AC
  Filled 2024-07-16: qty 90, 90d supply, fill #0

## 2024-07-17 ENCOUNTER — Other Ambulatory Visit (HOSPITAL_COMMUNITY): Payer: Self-pay

## 2024-07-20 NOTE — Progress Notes (Signed)
 VEGA STARE                                          MRN: [id number]   07/20/2024   The [facility] Quality Team Specialist reviewed this patient medical record for the purposes of chart review for care gap closure. The following were reviewed: chart review for care gap closure-kidney health evaluation for diabetes:eGFR  and uACR.    [facility] Quality Team

## 2025-06-18 ENCOUNTER — Ambulatory Visit
